# Patient Record
Sex: Female | Born: 1970 | Race: Black or African American | Hispanic: No | Marital: Single | Smoking: Current every day smoker
Health system: Southern US, Community
[De-identification: ages and names within clinical notes are randomized; demographics above are authoritative.]

## PROBLEM LIST (undated history)

## (undated) DIAGNOSIS — W3400XA Accidental discharge from unspecified firearms or gun, initial encounter: Secondary | ICD-10-CM

## (undated) DIAGNOSIS — K5909 Other constipation: Secondary | ICD-10-CM

## (undated) DIAGNOSIS — N390 Urinary tract infection, site not specified: Secondary | ICD-10-CM

## (undated) DIAGNOSIS — M542 Cervicalgia: Secondary | ICD-10-CM

## (undated) DIAGNOSIS — D649 Anemia, unspecified: Secondary | ICD-10-CM

## (undated) DIAGNOSIS — J40 Bronchitis, not specified as acute or chronic: Secondary | ICD-10-CM

## (undated) DIAGNOSIS — R569 Unspecified convulsions: Secondary | ICD-10-CM

## (undated) DIAGNOSIS — I10 Essential (primary) hypertension: Secondary | ICD-10-CM

## (undated) DIAGNOSIS — G8929 Other chronic pain: Secondary | ICD-10-CM

## (undated) DIAGNOSIS — R0602 Shortness of breath: Secondary | ICD-10-CM

## (undated) DIAGNOSIS — J45909 Unspecified asthma, uncomplicated: Secondary | ICD-10-CM

## (undated) HISTORY — PX: ABDOMINAL SURGERY: SHX537

---

## 1998-08-09 ENCOUNTER — Emergency Department (HOSPITAL_COMMUNITY): Admission: EM | Admit: 1998-08-09 | Discharge: 1998-08-09 | Payer: Self-pay | Admitting: Emergency Medicine

## 1998-10-21 ENCOUNTER — Other Ambulatory Visit: Admission: RE | Admit: 1998-10-21 | Discharge: 1998-10-21 | Payer: Self-pay | Admitting: Obstetrics

## 1999-10-20 ENCOUNTER — Inpatient Hospital Stay (HOSPITAL_COMMUNITY): Admission: AD | Admit: 1999-10-20 | Discharge: 1999-10-20 | Payer: Self-pay | Admitting: Obstetrics

## 2000-01-26 ENCOUNTER — Emergency Department (HOSPITAL_COMMUNITY): Admission: EM | Admit: 2000-01-26 | Discharge: 2000-01-26 | Payer: Self-pay | Admitting: Emergency Medicine

## 2000-02-02 ENCOUNTER — Emergency Department (HOSPITAL_COMMUNITY): Admission: EM | Admit: 2000-02-02 | Discharge: 2000-02-02 | Payer: Self-pay | Admitting: Emergency Medicine

## 2001-05-09 ENCOUNTER — Inpatient Hospital Stay (HOSPITAL_COMMUNITY): Admission: AD | Admit: 2001-05-09 | Discharge: 2001-05-09 | Payer: Self-pay | Admitting: Obstetrics

## 2001-06-30 ENCOUNTER — Other Ambulatory Visit: Admission: RE | Admit: 2001-06-30 | Discharge: 2001-06-30 | Payer: Self-pay | Admitting: Obstetrics and Gynecology

## 2001-11-25 ENCOUNTER — Encounter (INDEPENDENT_AMBULATORY_CARE_PROVIDER_SITE_OTHER): Payer: Self-pay

## 2001-11-25 ENCOUNTER — Ambulatory Visit (HOSPITAL_COMMUNITY): Admission: RE | Admit: 2001-11-25 | Discharge: 2001-11-25 | Payer: Self-pay | Admitting: Obstetrics and Gynecology

## 2002-02-24 ENCOUNTER — Emergency Department (HOSPITAL_COMMUNITY): Admission: EM | Admit: 2002-02-24 | Discharge: 2002-02-24 | Payer: Self-pay | Admitting: Emergency Medicine

## 2002-07-27 ENCOUNTER — Emergency Department (HOSPITAL_COMMUNITY): Admission: EM | Admit: 2002-07-27 | Discharge: 2002-07-27 | Payer: Self-pay | Admitting: Emergency Medicine

## 2002-10-25 ENCOUNTER — Emergency Department (HOSPITAL_COMMUNITY): Admission: EM | Admit: 2002-10-25 | Discharge: 2002-10-26 | Payer: Self-pay | Admitting: Emergency Medicine

## 2002-10-26 ENCOUNTER — Encounter: Payer: Self-pay | Admitting: Emergency Medicine

## 2002-11-07 ENCOUNTER — Emergency Department (HOSPITAL_COMMUNITY): Admission: EM | Admit: 2002-11-07 | Discharge: 2002-11-07 | Payer: Self-pay | Admitting: Emergency Medicine

## 2003-07-12 ENCOUNTER — Emergency Department (HOSPITAL_COMMUNITY): Admission: EM | Admit: 2003-07-12 | Discharge: 2003-07-12 | Payer: Self-pay

## 2003-08-29 ENCOUNTER — Emergency Department (HOSPITAL_COMMUNITY): Admission: EM | Admit: 2003-08-29 | Discharge: 2003-08-29 | Payer: Self-pay | Admitting: Emergency Medicine

## 2003-08-29 ENCOUNTER — Encounter: Payer: Self-pay | Admitting: Emergency Medicine

## 2004-10-10 ENCOUNTER — Emergency Department (HOSPITAL_COMMUNITY): Admission: EM | Admit: 2004-10-10 | Discharge: 2004-10-10 | Payer: Self-pay | Admitting: Emergency Medicine

## 2005-04-29 ENCOUNTER — Emergency Department (HOSPITAL_COMMUNITY): Admission: EM | Admit: 2005-04-29 | Discharge: 2005-04-29 | Payer: Self-pay | Admitting: Emergency Medicine

## 2005-09-16 ENCOUNTER — Emergency Department (HOSPITAL_COMMUNITY): Admission: EM | Admit: 2005-09-16 | Discharge: 2005-09-16 | Payer: Self-pay | Admitting: Family Medicine

## 2005-12-11 ENCOUNTER — Emergency Department (HOSPITAL_COMMUNITY): Admission: EM | Admit: 2005-12-11 | Discharge: 2005-12-11 | Payer: Self-pay | Admitting: Emergency Medicine

## 2007-07-30 ENCOUNTER — Emergency Department (HOSPITAL_COMMUNITY): Admission: EM | Admit: 2007-07-30 | Discharge: 2007-07-30 | Payer: Self-pay | Admitting: Emergency Medicine

## 2008-01-01 ENCOUNTER — Emergency Department (HOSPITAL_COMMUNITY): Admission: EM | Admit: 2008-01-01 | Discharge: 2008-01-01 | Payer: Self-pay | Admitting: Emergency Medicine

## 2008-01-04 ENCOUNTER — Emergency Department (HOSPITAL_COMMUNITY): Admission: EM | Admit: 2008-01-04 | Discharge: 2008-01-04 | Payer: Self-pay | Admitting: Emergency Medicine

## 2008-02-22 ENCOUNTER — Emergency Department (HOSPITAL_COMMUNITY): Admission: EM | Admit: 2008-02-22 | Discharge: 2008-02-22 | Payer: Self-pay | Admitting: Emergency Medicine

## 2008-07-27 ENCOUNTER — Emergency Department (HOSPITAL_COMMUNITY): Admission: EM | Admit: 2008-07-27 | Discharge: 2008-07-28 | Payer: Self-pay | Admitting: Emergency Medicine

## 2008-09-09 ENCOUNTER — Emergency Department (HOSPITAL_COMMUNITY): Admission: EM | Admit: 2008-09-09 | Discharge: 2008-09-10 | Payer: Self-pay | Admitting: Family Medicine

## 2010-02-08 ENCOUNTER — Inpatient Hospital Stay (HOSPITAL_COMMUNITY): Admission: AD | Admit: 2010-02-08 | Discharge: 2010-02-08 | Payer: Self-pay | Admitting: Obstetrics and Gynecology

## 2010-02-08 ENCOUNTER — Ambulatory Visit: Payer: Self-pay | Admitting: Obstetrics and Gynecology

## 2011-03-04 LAB — WET PREP, GENITAL: Trich, Wet Prep: NONE SEEN

## 2011-03-04 LAB — HERPES SIMPLEX VIRUS CULTURE

## 2011-04-04 ENCOUNTER — Inpatient Hospital Stay (HOSPITAL_COMMUNITY)
Admission: EM | Admit: 2011-04-04 | Discharge: 2011-04-08 | DRG: 313 | Disposition: A | Discharge status: Other Acute Inpatient Hospital | Payer: 59 | Attending: Internal Medicine | Admitting: Internal Medicine

## 2011-04-04 ENCOUNTER — Emergency Department (HOSPITAL_COMMUNITY): Payer: Self-pay

## 2011-04-04 DIAGNOSIS — R0789 Other chest pain: Principal | ICD-10-CM | POA: Diagnosis present

## 2011-04-04 DIAGNOSIS — J45909 Unspecified asthma, uncomplicated: Secondary | ICD-10-CM | POA: Diagnosis present

## 2011-04-04 DIAGNOSIS — D509 Iron deficiency anemia, unspecified: Secondary | ICD-10-CM | POA: Diagnosis present

## 2011-04-04 DIAGNOSIS — F341 Dysthymic disorder: Secondary | ICD-10-CM | POA: Diagnosis present

## 2011-04-04 LAB — BASIC METABOLIC PANEL
CO2: 25 mEq/L (ref 19–32)
Calcium: 9.7 mg/dL (ref 8.4–10.5)
Creatinine, Ser: 0.71 mg/dL (ref 0.4–1.2)
GFR calc Af Amer: >60 mL/min (ref >60)
GFR calc non Af Amer: >60 mL/min (ref >60)
Glucose, Bld: 84 mg/dL (ref 70–99)

## 2011-04-04 LAB — POCT CARDIAC MARKERS
CKMB, poc: <1 ng/mL — ABNORMAL LOW (ref 1.0–8.0)
Myoglobin, poc: 45.6 ng/mL (ref 12–200)
Myoglobin, poc: 43.4 ng/mL (ref 12–200)

## 2011-04-04 LAB — CBC
HCT: 35.6 % — ABNORMAL LOW (ref 36.0–46.0)
Hemoglobin: 11.9 g/dL — ABNORMAL LOW (ref 12.0–15.0)
MCHC: 33.4 g/dL (ref 30.0–36.0)
RBC: 4.04 MIL/uL (ref 3.87–5.11)
WBC: 7.1 10*3/uL (ref 4.0–10.5)

## 2011-04-04 LAB — DIFFERENTIAL
Basophils Absolute: 0 10*3/uL (ref 0.0–0.1)
Basophils Relative: 0 % (ref 0–1)
Lymphocytes Relative: 48 % — ABNORMAL HIGH (ref 12–46)
Monocytes Absolute: 0.5 10*3/uL (ref 0.1–1.0)
Neutro Abs: 3.1 10*3/uL (ref 1.7–7.7)
Neutrophils Relative %: 43 % (ref 43–77)

## 2011-04-05 ENCOUNTER — Emergency Department (HOSPITAL_COMMUNITY): Payer: Self-pay

## 2011-04-05 DIAGNOSIS — R072 Precordial pain: Secondary | ICD-10-CM

## 2011-04-05 LAB — CARDIAC PANEL(CRET KIN+CKTOT+MB+TROPI)
CK, MB: 0.6 ng/mL (ref 0.3–4.0)
Relative Index: 0.6 (ref 0.0–2.5)
Total CK: 104 U/L (ref 7–177)
Troponin I: <0.01 ng/mL (ref 0.00–0.06)

## 2011-04-05 LAB — LIPID PANEL
Cholesterol: 104 mg/dL (ref 0–200)
HDL: 44 mg/dL (ref >39)
Triglycerides: 84 mg/dL (ref <150)

## 2011-04-05 LAB — RAPID URINE DRUG SCREEN, HOSP PERFORMED
Amphetamines: NOT DETECTED
Barbiturates: NOT DETECTED
Benzodiazepines: NOT DETECTED
Opiates: POSITIVE — AB
Tetrahydrocannabinol: POSITIVE — AB

## 2011-04-05 LAB — PHOSPHORUS: Phosphorus: 4.1 mg/dL (ref 2.3–4.6)

## 2011-04-05 LAB — FOLATE: Folate: 9.6 ng/mL

## 2011-04-05 LAB — IRON AND TIBC: Iron: 65 ug/dL (ref 42–135)

## 2011-04-05 MED ORDER — IOHEXOL 300 MG/ML  SOLN
100.0000 mL | Freq: Once | INTRAMUSCULAR | Status: AC | PRN
  Administered 2011-04-05: 100 mL via INTRAVENOUS

## 2011-04-06 DIAGNOSIS — F331 Major depressive disorder, recurrent, moderate: Secondary | ICD-10-CM

## 2011-04-06 LAB — BASIC METABOLIC PANEL
CO2: 26 mEq/L (ref 19–32)
GFR calc non Af Amer: >60 mL/min (ref >60)
Glucose, Bld: 91 mg/dL (ref 70–99)
Potassium: 4 mEq/L (ref 3.5–5.1)
Sodium: 138 mEq/L (ref 135–145)

## 2011-04-06 LAB — CBC
HCT: 29 % — ABNORMAL LOW (ref 36.0–46.0)
Hemoglobin: 9.3 g/dL — ABNORMAL LOW (ref 12.0–15.0)
RBC: 3.23 MIL/uL — ABNORMAL LOW (ref 3.87–5.11)
RDW: 14.3 % (ref 11.5–15.5)
WBC: 5.6 10*3/uL (ref 4.0–10.5)

## 2011-04-08 ENCOUNTER — Inpatient Hospital Stay (HOSPITAL_COMMUNITY)
Admission: AD | Admit: 2011-04-08 | Discharge: 2011-04-09 | DRG: 885 | Disposition: A | Discharge status: Home / Self Care | Payer: PRIVATE HEALTH INSURANCE | Source: Ambulatory Visit | Attending: Psychiatry | Admitting: Psychiatry

## 2011-04-08 DIAGNOSIS — F1210 Cannabis abuse, uncomplicated: Secondary | ICD-10-CM

## 2011-04-08 DIAGNOSIS — IMO0002 Reserved for concepts with insufficient information to code with codable children: Secondary | ICD-10-CM

## 2011-04-08 DIAGNOSIS — J45909 Unspecified asthma, uncomplicated: Secondary | ICD-10-CM

## 2011-04-08 DIAGNOSIS — Z6379 Other stressful life events affecting family and household: Secondary | ICD-10-CM

## 2011-04-08 DIAGNOSIS — F332 Major depressive disorder, recurrent severe without psychotic features: Principal | ICD-10-CM

## 2011-04-08 DIAGNOSIS — F4310 Post-traumatic stress disorder, unspecified: Secondary | ICD-10-CM

## 2011-04-08 DIAGNOSIS — Z818 Family history of other mental and behavioral disorders: Secondary | ICD-10-CM
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
## 2011-04-10 NOTE — Discharge Summary (Addendum)
  [name], POLLIO               ACCOUNT NO.:  [id number]  MEDICAL RECORD NO.:  [id number]  LOCATION:                                 FACILITY:  PHYSICIAN:  Franchot Gallo, MD     DATE OF BIRTH:  10/05/1971  DATE OF ADMISSION:  04/07/2011 DATE OF DISCHARGE:                              DISCHARGE SUMMARY   REASON FOR ADMISSION:  Patient was a transfer from the medical floor after she was assessed for chest pain, admitted for grief issues regarding her sister's death.  FINAL IMPRESSION:  Bereavement. AXIS II:  Deferred. AXIS III:  Atypical chest pain. AXIS IV:  Psychosocial stressors. AXIS V:  55.  SIGNIFICANT LABS:  CBC had a hemoglobin of 11.9.  Basic metabolic panel within normal limits.  Cardiac markers were negative.  TSH was within normal limits.  Anemia panel showed a ferritin of 10.  SIGNIFICANT FINDINGS:  Patient was admitted to the adult milieu with continuing her discharge medications.  We changed her Prozac to Zoloft and continued her iron tablets and her Protonix.  She was attending groups.  We contacted her friend, Vernona Rieger, to gather collateral information, to provide information on suicide, and to address any safety concerns and her friend felt that her belief for patient's admission was that it was related to the grief over patient's sister and the fact that the recent trial about her sister's death by murder. Friend also stated that patient will stay with her for approximately a week so the patient could be in a calmer, less stressful situation.  On day of discharge, patient's depression was mild to moderate.  Her sleep was decreased.  Her appetite was fair but she denied any suicidal or homicidal thoughts or hallucinations or delusional thinking.  No manic or hypomanic symptoms.  She was requesting discharge today, that she must take care of her family members, grandfather and mother.  She was willing to continue her medications on an outpatient  basis.  DISCHARGE MEDICATIONS:  Included: 1. Zoloft 50 mg one daily. 2. Colace 1 pill twice a day as needed for constipation. 3. Ferrous sulfate 1 tablet t.i.d. 4. Protonix 40 mg daily. 5. Patient was to stop taking her Prozac.  FOLLOWUP APPOINTMENT:  With [facility] on Wednesday, Apr 15, 2011, at 9 a.m. at [phone].     Landry Corporal, N.P.   ______________________________ Franchot Gallo, MD    JO/MEDQ  D:  04/09/2011  T:  04/09/2011  Job:  [id number]  Electronically Signed by Franchot Gallo MD on 05/22/2011 06:11:59 PM

## 2011-04-11 NOTE — Discharge Summary (Signed)
NAMEADALIN, [name]               ACCOUNT NO.:  [id number]  MEDICAL RECORD NO.:  [id number]           PATIENT TYPE:  I  LOCATION:  1403                         FACILITY:  [facility]  PHYSICIAN:  Kathlen Mody, MD       DATE OF BIRTH:  Jan 23, 1971  DATE OF ADMISSION:  04/04/2011 DATE OF DISCHARGE:  04/07/2011                              DISCHARGE SUMMARY   DISCHARGE DIAGNOSES: 1. Atypical chest pain most likely secondary to anxiety versus     costochondritis. 2. Depression. 3. Iron-deficiency anemia.  DISCHARGE MEDICATIONS: 1. Protonix 40 mg daily. 2. Tylenol 325 mg p.o. 2 tablets q.4 h. p.r.n. 3. Prozac 20 mg 1 tablet daily. 4. Alprazolam 0.25 mg 1 tablet daily p.r.n. 5. Colace 100 mg b.i.d. p.r.n. 6. Ferrous sulfate 325 mg 1 tablet three times a day. 7. Tramadol 50 mg 1 tablet q.8 hours p.r.n.  PERTINENT LABS:  On admission, the patient had a CBC done which showed a hemoglobin of 11.9 and hematocrit of 35.6.  Point-of-care cardiac markers negative.  Basic metabolic panel within normal limits.  The next three sets of cardiac markers were negative.  TSH was within normal limits.  Anemia panel showed ferritin of 10.  Next CBC showed a hemoglobin of 9.3 with hematocrit of 29.  Basic metabolic panel was within normal limits.  RADIOLOGY:  The patient had a CT angiogram, which was negative for pulmonary embolus.  No pneumonia.  Chest x-ray did not show any acute findings.  CONSULTS CALLED:  Psychiatric consult.  BRIEF HOSPITAL COURSE:  40 year old lady with history of asthma was admitted for chest pain.  She was admitted to rule out acute coronary syndrome.  Her enzymes have been negative.  2D echocardiogram was within normal limits.  Her chest pain is atypical most likely secondary to anxiety/depression.  She was started on Xanax 0.25 mg on daily p.r.n. as needed which seem to have helped with the chest pain.  She was also started on Protonix for possible  heartburn.  Iron-deficiency anemia:  Her ferritin was low.  She was started on iron supplements three times a day.  Depression/anxiety.  The patient has depression with depressed mood, which was related to the death of her sister who died last year.  At this point, psychiatric consult was called, who recommended home Prozac 20 mg and also recommended with group therapy with medications stabilization.  Recommended transferring to [facility], once the patient is medically clear.  PHYSICAL EXAMINATION:  Vital Signs:  The patient's vitals today, temperature of 98.5, pulse of 55, respirations 16, blood pressure 109/72, saturating 100% on room air. General:  She is alert, afebrile, oriented x3 in no flat mood. Cardiovascular:  S1, S2 heard. Respiratory:  Good air entry bilaterally. Abdomen:  Soft, bowel sounds are heard. Extremities: No pedal edema.  DISPOSITION:  The patient is hemodynamically stable to be discharged to [facility].  She was recommended to follow up with PCP after being discharged from the [facility] in about 1 to 2 weeks.          ______________________________ Kathlen Mody, MD  VA/MEDQ  D:  04/07/2011  T:  04/07/2011  Job:  [id number]  Electronically Signed by Kathlen Mody MD on 04/11/2011 01:34:43 PM
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
## 2011-08-22 ENCOUNTER — Emergency Department (HOSPITAL_COMMUNITY): Payer: PRIVATE HEALTH INSURANCE

## 2011-08-22 ENCOUNTER — Emergency Department (HOSPITAL_COMMUNITY)
Admission: EM | Admit: 2011-08-22 | Discharge: 2011-08-23 | Disposition: A | Discharge status: Home / Self Care | Payer: PRIVATE HEALTH INSURANCE | Attending: Emergency Medicine | Admitting: Emergency Medicine

## 2011-08-22 DIAGNOSIS — W010XXA Fall on same level from slipping, tripping and stumbling without subsequent striking against object, initial encounter: Secondary | ICD-10-CM | POA: Insufficient documentation

## 2011-08-22 DIAGNOSIS — Y9229 Other specified public building as the place of occurrence of the external cause: Secondary | ICD-10-CM | POA: Insufficient documentation

## 2011-08-22 DIAGNOSIS — M542 Cervicalgia: Secondary | ICD-10-CM | POA: Insufficient documentation

## 2011-08-22 DIAGNOSIS — R209 Unspecified disturbances of skin sensation: Secondary | ICD-10-CM | POA: Insufficient documentation

## 2011-08-22 DIAGNOSIS — J45909 Unspecified asthma, uncomplicated: Secondary | ICD-10-CM | POA: Insufficient documentation

## 2011-08-22 DIAGNOSIS — S40019A Contusion of unspecified shoulder, initial encounter: Secondary | ICD-10-CM | POA: Insufficient documentation

## 2011-08-22 DIAGNOSIS — M25519 Pain in unspecified shoulder: Secondary | ICD-10-CM | POA: Insufficient documentation

## 2011-08-25 ENCOUNTER — Emergency Department (HOSPITAL_COMMUNITY)
Admission: EM | Admit: 2011-08-25 | Discharge: 2011-08-25 | Disposition: A | Discharge status: Home / Self Care | Payer: PRIVATE HEALTH INSURANCE | Attending: Emergency Medicine | Admitting: Emergency Medicine

## 2011-08-25 ENCOUNTER — Emergency Department (HOSPITAL_COMMUNITY): Payer: PRIVATE HEALTH INSURANCE

## 2011-08-25 DIAGNOSIS — S2239XA Fracture of one rib, unspecified side, initial encounter for closed fracture: Secondary | ICD-10-CM | POA: Insufficient documentation

## 2011-08-25 DIAGNOSIS — Y92009 Unspecified place in unspecified non-institutional (private) residence as the place of occurrence of the external cause: Secondary | ICD-10-CM | POA: Insufficient documentation

## 2011-08-25 DIAGNOSIS — R079 Chest pain, unspecified: Secondary | ICD-10-CM | POA: Insufficient documentation

## 2011-08-25 DIAGNOSIS — W19XXXA Unspecified fall, initial encounter: Secondary | ICD-10-CM | POA: Insufficient documentation

## 2011-08-25 DIAGNOSIS — J45909 Unspecified asthma, uncomplicated: Secondary | ICD-10-CM | POA: Insufficient documentation

## 2011-09-07 LAB — POCT PREGNANCY, URINE: Operator id: 285841

## 2011-09-11 LAB — URINALYSIS, ROUTINE W REFLEX MICROSCOPIC
Bilirubin Urine: NEGATIVE
Nitrite: NEGATIVE
Specific Gravity, Urine: 1.005
pH: 8

## 2011-09-11 LAB — URINE MICROSCOPIC-ADD ON

## 2011-09-11 LAB — POCT PREGNANCY, URINE: Preg Test, Ur: NEGATIVE

## 2011-09-14 LAB — DIFFERENTIAL
Basophils Relative: 0
Eosinophils Absolute: 0
Lymphs Abs: 0.7
Monocytes Absolute: 0.2
Monocytes Relative: 3

## 2011-09-14 LAB — URINALYSIS, ROUTINE W REFLEX MICROSCOPIC
Glucose, UA: NEGATIVE
Hgb urine dipstick: NEGATIVE
Protein, ur: 30 — AB
pH: 6.5

## 2011-09-14 LAB — COMPREHENSIVE METABOLIC PANEL
ALT: 18
Albumin: 3.9
Alkaline Phosphatase: 52
GFR calc Af Amer: >60
Potassium: 3.8
Sodium: 142
Total Protein: 7.5

## 2011-09-14 LAB — URINE MICROSCOPIC-ADD ON

## 2011-09-14 LAB — CBC
Platelets: 288
RDW: 14.9

## 2011-09-14 LAB — POCT PREGNANCY, URINE: Preg Test, Ur: NEGATIVE

## 2011-10-13 ENCOUNTER — Ambulatory Visit
Payer: PRIVATE HEALTH INSURANCE | Attending: Orthopedic Surgery | Admitting: Rehabilitative and Restorative Service Providers"

## 2011-10-13 DIAGNOSIS — M25519 Pain in unspecified shoulder: Secondary | ICD-10-CM | POA: Insufficient documentation

## 2011-10-13 DIAGNOSIS — IMO0001 Reserved for inherently not codable concepts without codable children: Secondary | ICD-10-CM | POA: Insufficient documentation

## 2011-10-13 DIAGNOSIS — M542 Cervicalgia: Secondary | ICD-10-CM | POA: Insufficient documentation

## 2011-10-19 ENCOUNTER — Ambulatory Visit: Payer: PRIVATE HEALTH INSURANCE | Attending: Orthopedic Surgery | Admitting: Physical Therapy

## 2011-10-19 DIAGNOSIS — M542 Cervicalgia: Secondary | ICD-10-CM | POA: Insufficient documentation

## 2011-10-19 DIAGNOSIS — IMO0001 Reserved for inherently not codable concepts without codable children: Secondary | ICD-10-CM | POA: Insufficient documentation

## 2011-10-19 DIAGNOSIS — M25519 Pain in unspecified shoulder: Secondary | ICD-10-CM | POA: Insufficient documentation

## 2011-10-21 ENCOUNTER — Ambulatory Visit: Payer: PRIVATE HEALTH INSURANCE | Admitting: Physical Therapy

## 2011-10-26 ENCOUNTER — Ambulatory Visit: Payer: PRIVATE HEALTH INSURANCE | Admitting: Rehabilitative and Restorative Service Providers"

## 2011-10-28 ENCOUNTER — Ambulatory Visit: Payer: PRIVATE HEALTH INSURANCE | Admitting: Rehabilitative and Restorative Service Providers"

## 2011-11-11 ENCOUNTER — Ambulatory Visit: Payer: PRIVATE HEALTH INSURANCE | Admitting: Rehabilitation

## 2011-11-13 ENCOUNTER — Ambulatory Visit: Payer: PRIVATE HEALTH INSURANCE | Admitting: Rehabilitative and Restorative Service Providers"

## 2011-11-16 ENCOUNTER — Encounter: Payer: PRIVATE HEALTH INSURANCE | Admitting: Rehabilitative and Restorative Service Providers"

## 2011-11-17 ENCOUNTER — Ambulatory Visit
Payer: PRIVATE HEALTH INSURANCE | Attending: Orthopedic Surgery | Admitting: Rehabilitative and Restorative Service Providers"

## 2011-11-17 DIAGNOSIS — M25519 Pain in unspecified shoulder: Secondary | ICD-10-CM | POA: Insufficient documentation

## 2011-11-17 DIAGNOSIS — IMO0001 Reserved for inherently not codable concepts without codable children: Secondary | ICD-10-CM | POA: Insufficient documentation

## 2011-11-17 DIAGNOSIS — M542 Cervicalgia: Secondary | ICD-10-CM | POA: Insufficient documentation

## 2011-11-18 ENCOUNTER — Ambulatory Visit: Payer: PRIVATE HEALTH INSURANCE | Admitting: Rehabilitative and Restorative Service Providers"

## 2011-11-24 ENCOUNTER — Ambulatory Visit: Payer: PRIVATE HEALTH INSURANCE | Admitting: Rehabilitation

## 2011-11-27 ENCOUNTER — Ambulatory Visit: Payer: PRIVATE HEALTH INSURANCE | Admitting: Rehabilitation

## 2011-12-01 ENCOUNTER — Ambulatory Visit: Payer: PRIVATE HEALTH INSURANCE | Admitting: Rehabilitative and Restorative Service Providers"

## 2011-12-04 ENCOUNTER — Ambulatory Visit: Payer: PRIVATE HEALTH INSURANCE | Admitting: Rehabilitation

## 2012-01-01 ENCOUNTER — Other Ambulatory Visit (HOSPITAL_COMMUNITY): Payer: Self-pay | Admitting: Orthopedic Surgery

## 2012-01-01 DIAGNOSIS — IMO0002 Reserved for concepts with insufficient information to code with codable children: Secondary | ICD-10-CM

## 2012-01-08 ENCOUNTER — Ambulatory Visit (HOSPITAL_COMMUNITY)
Admission: RE | Admit: 2012-01-08 | Discharge: 2012-01-08 | Disposition: A | Discharge status: Home / Self Care | Payer: PRIVATE HEALTH INSURANCE | Source: Ambulatory Visit | Attending: Orthopedic Surgery | Admitting: Orthopedic Surgery

## 2012-01-08 DIAGNOSIS — M79609 Pain in unspecified limb: Secondary | ICD-10-CM | POA: Insufficient documentation

## 2012-01-08 DIAGNOSIS — IMO0002 Reserved for concepts with insufficient information to code with codable children: Secondary | ICD-10-CM

## 2012-01-08 DIAGNOSIS — M542 Cervicalgia: Secondary | ICD-10-CM | POA: Insufficient documentation

## 2012-01-08 DIAGNOSIS — M5020 Other cervical disc displacement, unspecified cervical region: Secondary | ICD-10-CM | POA: Insufficient documentation

## 2012-10-13 ENCOUNTER — Encounter (HOSPITAL_COMMUNITY): Payer: Self-pay

## 2012-10-13 ENCOUNTER — Emergency Department (HOSPITAL_COMMUNITY): Payer: Self-pay

## 2012-10-13 ENCOUNTER — Emergency Department (HOSPITAL_COMMUNITY)
Admission: EM | Admit: 2012-10-13 | Discharge: 2012-10-14 | Disposition: A | Discharge status: Home / Self Care | Payer: Self-pay | Attending: Emergency Medicine | Admitting: Emergency Medicine

## 2012-10-13 DIAGNOSIS — M5412 Radiculopathy, cervical region: Secondary | ICD-10-CM | POA: Insufficient documentation

## 2012-10-13 DIAGNOSIS — F17200 Nicotine dependence, unspecified, uncomplicated: Secondary | ICD-10-CM | POA: Insufficient documentation

## 2012-10-13 DIAGNOSIS — J45909 Unspecified asthma, uncomplicated: Secondary | ICD-10-CM | POA: Insufficient documentation

## 2012-10-13 DIAGNOSIS — N390 Urinary tract infection, site not specified: Secondary | ICD-10-CM | POA: Insufficient documentation

## 2012-10-13 HISTORY — DX: Unspecified asthma, uncomplicated: J45.909

## 2012-10-13 HISTORY — DX: Bronchitis, not specified as acute or chronic: J40

## 2012-10-13 MED ORDER — HYDROMORPHONE HCL PF 1 MG/ML IJ SOLN
0.5000 mg | Freq: Once | INTRAMUSCULAR | Status: AC
  Administered 2012-10-13: 0.5 mg via INTRAVENOUS
  Filled 2012-10-13: qty 1

## 2012-10-13 MED ORDER — MORPHINE SULFATE 4 MG/ML IJ SOLN
4.0000 mg | Freq: Once | INTRAMUSCULAR | Status: AC
  Administered 2012-10-13: 4 mg via INTRAVENOUS
  Filled 2012-10-13: qty 1

## 2012-10-13 MED ORDER — PREDNISONE (PAK) 10 MG PO TABS: ORAL_TABLET | ORAL | Status: DC

## 2012-10-13 MED ORDER — DIPHENHYDRAMINE HCL 50 MG/ML IJ SOLN
25.0000 mg | Freq: Four times a day (QID) | INTRAMUSCULAR | Status: DC | PRN
  Administered 2012-10-13: 25 mg via INTRAVENOUS
  Filled 2012-10-13: qty 1

## 2012-10-13 MED ORDER — ONDANSETRON HCL 4 MG/2ML IJ SOLN
4.0000 mg | Freq: Once | INTRAMUSCULAR | Status: AC
  Administered 2012-10-13: 4 mg via INTRAVENOUS
  Filled 2012-10-13: qty 2

## 2012-10-13 NOTE — ED Notes (Signed)
Report given to Dee, RN 

## 2012-10-13 NOTE — ED Notes (Signed)
Asked pt about pain; pt stated it is still a 7/10. Asked pt if she would like anything for pain of to ask doctor about pain medication for her. Pt states that she could wait a few minutes since they gave her something before she came to CDU, but said that doctor could be asked about pain medication in a few minutes.

## 2012-10-13 NOTE — ED Notes (Signed)
Patient injured her right shoulder and arm from a fall in September

## 2012-10-13 NOTE — ED Provider Notes (Signed)
History     CSN: [id number]  Arrival date & time 10/13/12  1510   First MD Initiated Contact with Patient 10/13/12 1548      Chief Complaint  Patient presents with  . Shoulder Pain    (Consider location/radiation/quality/duration/timing/severity/associated sxs/prior treatment) HPI Comments: This is a 41 year old female, who presents to the emergency department with chief complaint of right shoulder pain times one week. Patient states that the pain has been worsening. She has been seeing a chiropractor, but has lost followup. Today she says that she feels increased pain in her right arm. She tells me she has taken a muscle relaxant and Tylenol for pain, was literally. She denies any focal neural deficits. She states that she has not had any bowel incontinence, but has been incontinent for urine 2 in the past week. She states that she can feel her bladder filling, and has good sensation desire to urinate, but sometimes she is unable to make it to the bathroom before she has already urinated.  She has not had prior back surgery.  She denies headache, chest pain, and SOB.  The history is provided by the patient. No language interpreter was used.    Past Medical History  Diagnosis Date  . Asthma   . Bronchitis     Past Surgical History  Procedure Date  . Abdominal surgery     No family history on file.  History  Substance Use Topics  . Smoking status: Current Every Day Smoker  . Smokeless tobacco: Not on file  . Alcohol Use: No    OB History    Grav Para Term Preterm Abortions TAB SAB Ect Mult Living                  Review of Systems  Musculoskeletal:       Right arm pain  All other systems reviewed and are negative.    Allergies  Review of patient's allergies indicates no known allergies.  Home Medications  No current outpatient prescriptions on file.  BP 120/93  Pulse 71  Temp 97.9 F (36.6 C) (Oral)  Resp 14  SpO2 100%  LMP 10/12/2012  Physical Exam    Nursing note and vitals reviewed. Constitutional: She is oriented to person, place, and time. She appears well-developed and well-nourished.  HENT:  Head: Normocephalic and atraumatic.  Eyes: Conjunctivae normal and EOM are normal. Pupils are equal, round, and reactive to light.  Neck: Normal range of motion. Neck supple.  Cardiovascular: Normal rate, regular rhythm and normal heart sounds.   Abdominal: Soft. She exhibits no distension and no mass. There is no tenderness. There is no rebound and no guarding.  Genitourinary:       Good rectal tone  Musculoskeletal:       Sensation intact bilaterally, strength is 4/5 in the right arm. Distal pulses are equal. Capillary refill is brisk.  Neurological: She is alert and oriented to person, place, and time.       Reflexes are equal bilaterally  Skin: Skin is warm and dry.  Psychiatric: She has a normal mood and affect. Her behavior is normal. Judgment and thought content normal.    ED Course  Procedures (including critical care time)  Labs Reviewed - No data to display No results found.   No diagnosis found.    MDM   41 year old female with right arm pain. This patient has been seen by and discussed with Dr. Bebe Shaggy.   5:31 PM We are going to  postvoid residual study to evaluate for overflow incontinence.  6:09 PM Bedside ultrasound performed by Dr. Bebe Shaggy.  Patient still had quite a bit of urine retained in bladder.  Rectal tone is good.  We are going to send the patient to the CDU, and order MRI of L-spine and T-spine.  Patient is to be discharged home with surgical follow-up if MRI results do not suggest emergent surgery would be necessary.  Patient has been discussed with Adaline Sill, NP, who will resume care at this time.         Roxy Horseman, PA-C 10/13/12 2006

## 2012-10-13 NOTE — ED Notes (Signed)
Back in to reassess patient, pt left antecubital red around visible vein. No redness or swelling around forearm IV site. When giving meds, no problem occurred, no pain or redness. IV site removed, PA made aware. Pt given heat pack to antecubital.

## 2012-10-13 NOTE — ED Notes (Addendum)
Pt states she fell a year ago onto her right shoulder, but this week her shoulder has been giving her 10/10 achy pain. She states "I feel like my whole arm is trying to come off, thumb(of right hand) is numb and cold". Pt A&Ox4, ambulatory. Pt was supposed to get neck surgery, she is searching for a laser surgery option.

## 2012-10-13 NOTE — ED Notes (Signed)
Pt is back in room from MRI.

## 2012-10-13 NOTE — ED Provider Notes (Signed)
MRI results reviewed and discussed with Dr. Bebe Shaggy, shared with patient.  No significant findings in thoracic or lumbar spine.  Patient continues to report pain in right shoulder and paresthesia to right hand, likely as result of her cervical spine findings at C5-6 (from 01/08/12):  Congenital stenosis. Central disc extrusion with significant mass effect on the cord. Canal diameter 5 mm. Central cord hyperintensity. Bilateral C6 nerve root encroachment. Patient has been evaluated previously by Dr. Yevette Edwards for same, with recommendation for surgical intervention.  Patient has been reluctant to undergo surgery.  Will start on a short course of steroids and provide prescription for norco.  Patient encouraged to continue follow-up with Dr. Yevette Edwards.  Will also check urine to r/o infection as cause of urinary frequency/retention.  Kristen Norman, NP 10/14/12 0020

## 2012-10-13 NOTE — ED Notes (Signed)
Patient presents with right shoulder and arm pain x 1 week with worsening pain today.  Patient denies chest pain and SOB. Patient tearful upon triage and states "my hands feel icy".

## 2012-10-13 NOTE — ED Notes (Signed)
Patient transported to MRI 

## 2012-10-13 NOTE — ED Provider Notes (Signed)
Pt with h/o cervical spine disease here with worsened pain.  No focal arm weakness noted but reports thoracic spinal tenderness.  She also reports urinary incontinence - post void residual does indicate increased bladder volume.  Given she has focal spinal tenderness will obtain MR thoracic/lumbar spine.   Joya Gaskins, MD 10/13/12 [phone]

## 2012-10-13 NOTE — ED Notes (Signed)
MD at bedside. 

## 2012-10-14 LAB — URINALYSIS, ROUTINE W REFLEX MICROSCOPIC
Bilirubin Urine: NEGATIVE
Ketones, ur: NEGATIVE mg/dL
Nitrite: NEGATIVE
Protein, ur: NEGATIVE mg/dL
Urobilinogen, UA: 1 mg/dL (ref 0.0–1.0)
pH: 7 (ref 5.0–8.0)

## 2012-10-14 LAB — URINE MICROSCOPIC-ADD ON

## 2012-10-14 MED ORDER — SULFAMETHOXAZOLE-TRIMETHOPRIM 800-160 MG PO TABS: 1.0000 | ORAL_TABLET | Freq: Two times a day (BID) | ORAL | Status: DC

## 2012-10-14 MED ORDER — HYDROCODONE-ACETAMINOPHEN 5-325 MG PO TABS: 1.0000 | ORAL_TABLET | Freq: Four times a day (QID) | ORAL | Status: DC | PRN

## 2012-10-14 NOTE — ED Provider Notes (Signed)
Medical screening examination/treatment/procedure(s) were conducted as a shared visit with non-physician practitioner(s) and myself.  I personally evaluated the patient during the encounter   Joya Gaskins, MD 10/14/12 1534

## 2012-10-14 NOTE — ED Provider Notes (Signed)
Medical screening examination/treatment/procedure(s) were conducted as a shared visit with non-physician practitioner(s) and myself.  I personally evaluated the patient during the encounter   Joya Gaskins, MD 10/14/12 1531

## 2012-11-01 ENCOUNTER — Other Ambulatory Visit (HOSPITAL_COMMUNITY): Payer: Self-pay | Admitting: Neurosurgery

## 2012-11-01 DIAGNOSIS — M5020 Other cervical disc displacement, unspecified cervical region: Secondary | ICD-10-CM

## 2012-11-02 ENCOUNTER — Emergency Department (HOSPITAL_COMMUNITY)
Admission: EM | Admit: 2012-11-02 | Discharge: 2012-11-02 | Disposition: A | Discharge status: Home / Self Care | Payer: Self-pay | Attending: Emergency Medicine | Admitting: Emergency Medicine

## 2012-11-02 ENCOUNTER — Encounter (HOSPITAL_COMMUNITY): Payer: Self-pay | Admitting: Physical Medicine and Rehabilitation

## 2012-11-02 DIAGNOSIS — M79609 Pain in unspecified limb: Secondary | ICD-10-CM | POA: Insufficient documentation

## 2012-11-02 DIAGNOSIS — F17200 Nicotine dependence, unspecified, uncomplicated: Secondary | ICD-10-CM | POA: Insufficient documentation

## 2012-11-02 DIAGNOSIS — M79603 Pain in arm, unspecified: Secondary | ICD-10-CM

## 2012-11-02 DIAGNOSIS — J45909 Unspecified asthma, uncomplicated: Secondary | ICD-10-CM | POA: Insufficient documentation

## 2012-11-02 MED ORDER — OXYCODONE-ACETAMINOPHEN 5-325 MG PO TABS: 1.0000 | ORAL_TABLET | ORAL | Status: DC | PRN

## 2012-11-02 MED ORDER — MORPHINE SULFATE 4 MG/ML IJ SOLN
4.0000 mg | Freq: Once | INTRAMUSCULAR | Status: AC
  Administered 2012-11-02: 4 mg via INTRAMUSCULAR
  Filled 2012-11-02: qty 1

## 2012-11-02 NOTE — ED Notes (Signed)
Patient is alert and orientedx4.  Patient was explained discharge instructions and she had no questions.  We did provide the patient with a resource guide so that she has options if she needs further help.

## 2012-11-02 NOTE — ED Notes (Signed)
C/o right arm pain since before weekend, states right thumb numb due to disc problems, was seen for this late October, was given MRI and antibiotics. Mop deformity or swelling noted to arm, pt crying, also c/o constipation

## 2012-11-02 NOTE — Progress Notes (Signed)
Orthopedic Tech Progress Note Patient Details:  [name] 14-Apr-1971 [id number]  Ortho Devices Type of Ortho Device: Arm foam sling Ortho Device/Splint Location: RIGHT ARM SLING Ortho Device/Splint Interventions: Application   Cammer, Mickie Bail 11/02/2012, 3:41 PM

## 2012-11-02 NOTE — ED Provider Notes (Signed)
History    This chart was scribed for Kristen Guppy, MD, MD by Smitty Pluck, ED Scribe. The patient was seen in room TR05C and the patient's care was started at 2:33PM.   CSN: [id number]  Arrival date & time 11/02/12  1342   None     Chief Complaint  Patient presents with  . Arm Pain    (Consider location/radiation/quality/duration/timing/severity/associated sxs/prior treatment) Patient is a 41 y.o. female presenting with arm pain. The history is provided by the patient. No language interpreter was used.  Arm Pain Pertinent negatives include no shortness of breath.   [name] is a 41 y.o. female who presents to the Emergency Department complaining of constant, severe right arm pain onset 20 days ago with symptoms worsening 4 days ago. Pt reports that he fell and injured her arm in 08-2011. Pt reports pain is rated at 1010. She reports she feels a small mass that travels throughout her arm. Denies any other pain.    Past Medical History  Diagnosis Date  . Asthma   . Bronchitis     Past Surgical History  Procedure Date  . Abdominal surgery     No family history on file.  History  Substance Use Topics  . Smoking status: Current Every Day Smoker    Types: Cigarettes  . Smokeless tobacco: Not on file  . Alcohol Use: No    OB History    Grav Para Term Preterm Abortions TAB SAB Ect Mult Living                  Review of Systems  Constitutional: Negative for fever and chills.  Respiratory: Negative for shortness of breath.   Gastrointestinal: Negative for nausea and vomiting.  Neurological: Negative for weakness.  All other systems reviewed and are negative.    Allergies  Review of patient's allergies indicates no known allergies.  Home Medications   Current Outpatient Rx  Name  Route  Sig  Dispense  Refill  . HYDROCODONE-ACETAMINOPHEN 5-325 MG PO TABS   Oral   Take 1 tablet by mouth every 6 (six) hours as needed for pain.   20 tablet   0   .  PREDNISONE (PAK) 10 MG PO TABS      Take 6 tabs day 1, 5 tabs day 2, 4 tabs day 3, 3 tabs day 4, 2 tabs day 5, 1 tab day 6   21 tablet   0   . SULFAMETHOXAZOLE-TRIMETHOPRIM 800-160 MG PO TABS   Oral   Take 1 tablet by mouth every 12 (twelve) hours.   20 tablet   0     BP 150/104  Pulse 99  Temp 98.1 F (36.7 C) (Oral)  Resp 18  SpO2 100%  LMP 10/12/2012  Physical Exam  Nursing note and vitals reviewed. Constitutional: She is oriented to person, place, and time. She appears well-developed and well-nourished. No distress.  HENT:  Head: Normocephalic and atraumatic.  Eyes: EOM are normal.  Neck: Neck supple. No tracheal deviation present.  Cardiovascular: Normal rate.   Pulmonary/Chest: Effort normal. No respiratory distress.  Musculoskeletal: Normal range of motion.       No swelling in right arm No ecchymosis of right arm No deformity of right arm Pt is significantly tender in right bicep Not able to palpate any masses in right arm Left arm nl Left hand dominant  Neurological: She is alert and oriented to person, place, and time.  Skin: Skin is warm and dry.  Psychiatric: She has a normal mood and affect. Her behavior is normal.    ED Course  Procedures (including critical care time) DIAGNOSTIC STUDIES: Oxygen Saturation is 100% on room air, normal by my interpretation.    COORDINATION OF CARE: 2:36 PM Discussed ED treatment with pt  2:40 PM Ordered:    .  morphine injection  4 mg Intramuscular Once       Labs Reviewed - No data to display No results found.   No diagnosis found.    MDM  Arm pain, chronic since fall a few months ago.  Possible biceps tear.         I personally performed the services described in this documentation, which was scribed in my presence. The recorded information has been reviewed and is accurate.    Kristen Guppy, MD 11/02/12 1444

## 2012-11-02 NOTE — ED Notes (Signed)
Pt presents to department for evaluation of chronic R arm pain. Ongoing for several months, but states pain has become worse x4 days. 10/10 pain at the time. Able to move extremity, no swelling noted. States she ran out of pain medication. She is conscious alert and oriented x4. Tearful upon arrival to triage.

## 2012-11-03 ENCOUNTER — Ambulatory Visit (HOSPITAL_COMMUNITY)
Admission: RE | Admit: 2012-11-03 | Discharge: 2012-11-03 | Disposition: A | Discharge status: Home / Self Care | Payer: Self-pay | Source: Ambulatory Visit | Attending: Neurosurgery | Admitting: Neurosurgery

## 2012-11-03 DIAGNOSIS — M79609 Pain in unspecified limb: Secondary | ICD-10-CM | POA: Insufficient documentation

## 2012-11-03 DIAGNOSIS — M5020 Other cervical disc displacement, unspecified cervical region: Secondary | ICD-10-CM | POA: Insufficient documentation

## 2012-11-09 ENCOUNTER — Other Ambulatory Visit: Payer: Self-pay | Admitting: Neurosurgery

## 2012-12-01 ENCOUNTER — Encounter (HOSPITAL_COMMUNITY)
Admission: RE | Admit: 2012-12-01 | Discharge: 2012-12-01 | Disposition: A | Discharge status: Home / Self Care | Payer: Self-pay | Source: Ambulatory Visit | Attending: Neurosurgery | Admitting: Neurosurgery

## 2012-12-01 ENCOUNTER — Encounter (HOSPITAL_COMMUNITY): Payer: Self-pay | Admitting: Pharmacy Technician

## 2012-12-01 ENCOUNTER — Encounter (HOSPITAL_COMMUNITY): Payer: Self-pay

## 2012-12-01 ENCOUNTER — Encounter (HOSPITAL_COMMUNITY)
Admission: RE | Admit: 2012-12-01 | Discharge: 2012-12-01 | Disposition: A | Discharge status: Home / Self Care | Payer: Self-pay | Source: Ambulatory Visit | Attending: Anesthesiology | Admitting: Anesthesiology

## 2012-12-01 HISTORY — DX: Unspecified convulsions: R56.9

## 2012-12-01 HISTORY — DX: Anemia, unspecified: D64.9

## 2012-12-01 HISTORY — DX: Cervicalgia: M54.2

## 2012-12-01 HISTORY — DX: Urinary tract infection, site not specified: N39.0

## 2012-12-01 HISTORY — DX: Other constipation: K59.09

## 2012-12-01 HISTORY — DX: Shortness of breath: R06.02

## 2012-12-01 HISTORY — DX: Other chronic pain: G89.29

## 2012-12-01 LAB — SURGICAL PCR SCREEN: Staphylococcus aureus: NEGATIVE

## 2012-12-01 LAB — CBC
HCT: 34 % — ABNORMAL LOW (ref 36.0–46.0)
Hemoglobin: 11.6 g/dL — ABNORMAL LOW (ref 12.0–15.0)
MCH: 29.8 pg (ref 26.0–34.0)
RBC: 3.89 MIL/uL (ref 3.87–5.11)

## 2012-12-01 LAB — BASIC METABOLIC PANEL
BUN: 7 mg/dL (ref 6–23)
CO2: 24 mEq/L (ref 19–32)
Calcium: 9.6 mg/dL (ref 8.4–10.5)
Glucose, Bld: 87 mg/dL (ref 70–99)
Sodium: 138 mEq/L (ref 135–145)

## 2012-12-01 NOTE — Pre-Procedure Instructions (Signed)
22 [name]  12/01/2012   Your procedure is scheduled on:  Friday December 09, 2012  Report to [facility] at 5:30 AM.  Call this number if you have problems the morning of surgery: [phone]   Remember:   Do not eat food or drink After Midnight.    Take these medicines the morning of surgery with A SIP OF WATER: oxycodone,    Do not wear jewelry, make-up or nail polish.  Do not wear lotions, powders, or perfumes.  Do not shave 48 hours prior to surgery.  Do not bring valuables to the hospital.  Contacts, dentures or bridgework may not be worn into surgery.  Leave suitcase in the car. After surgery it may be brought to your room.  For patients admitted to the hospital, checkout time is 11:00 AM the day of discharge.   Patients discharged the day of surgery will not be allowed to drive home.  Name and phone number of your driver: family / friend  Special Instructions: Shower using CHG 2 nights before surgery and the night before surgery.  If you shower the day of surgery use CHG.  Use special wash - you have one bottle of CHG for all showers.  You should use approximately 1/3 of the bottle for each shower.   Please read over the following fact sheets that you were given: Pain Booklet, Coughing and Deep Breathing, MRSA Information and Surgical Site Infection Prevention

## 2012-12-02 NOTE — Consult Note (Signed)
Anesthesia chart review: Patient is a 41 year old female scheduled for C5-6 ACDF on 12/09/12 by Dr. Gerlene Fee.  History includes smoking, asthma, bronchitis, anemia, history of seizures (not currently requiring treatment), depression, prior abdominal surgery.  Of note, she was admitted in April 2012 for evaluation of chest pain and depression/anxiety.  She ruled out for MI by enzymes and PE by CTA.Marland Kitchen  Her echo was WNL.  Her symptoms were felt to be due to anxiety or costochondritis.  No additional testing was recommended at that time.  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  Overall, I think it is stable since at least 08/29/03.    Echo on 04/05/2011 showed normal LV cavity size. Mild physical basal hypertrophy of the septum. Normal systolic function. Estimated EF 55-60%. Normal wall motion. No regional wall abnormalities. Left ventricular diastolic function parameters were normal. Trivial mitral regurgitation. Trivial pulmonic regurgitation.  Trivial tricuspid regurgitation.  Chest x-ray on 12/01/2012 showed no active disease.  Preoperative labs noted.  If no acute CV symptoms or change in her status then anticipate she can proceed as planned.  Shonna Chock, PA-C 12/02/12 1335

## 2012-12-08 MED ORDER — DEXAMETHASONE SODIUM PHOSPHATE 10 MG/ML IJ SOLN
10.0000 mg | INTRAMUSCULAR | Status: AC
  Administered 2012-12-09: 10 mg via INTRAVENOUS
  Filled 2012-12-08: qty 1

## 2012-12-08 MED ORDER — CEFAZOLIN SODIUM-DEXTROSE 2-3 GM-% IV SOLR
2.0000 g | INTRAVENOUS | Status: AC
  Administered 2012-12-09: 2 g via INTRAVENOUS
  Filled 2012-12-08: qty 50

## 2012-12-09 ENCOUNTER — Encounter (HOSPITAL_COMMUNITY): Payer: Self-pay | Admitting: Vascular Surgery

## 2012-12-09 ENCOUNTER — Ambulatory Visit (HOSPITAL_COMMUNITY): Payer: Self-pay

## 2012-12-09 ENCOUNTER — Encounter (HOSPITAL_COMMUNITY)
Admission: RE | Disposition: A | Discharge status: Home / Self Care | Payer: Self-pay | Source: Ambulatory Visit | Attending: Neurosurgery

## 2012-12-09 ENCOUNTER — Encounter (HOSPITAL_COMMUNITY): Payer: Self-pay | Admitting: Surgery

## 2012-12-09 ENCOUNTER — Ambulatory Visit (HOSPITAL_COMMUNITY): Payer: Self-pay | Admitting: Vascular Surgery

## 2012-12-09 ENCOUNTER — Ambulatory Visit (HOSPITAL_COMMUNITY)
Admission: RE | Admit: 2012-12-09 | Discharge: 2012-12-10 | Disposition: A | Discharge status: Home / Self Care | Payer: MEDICAID | Source: Ambulatory Visit | Attending: Neurosurgery | Admitting: Neurosurgery

## 2012-12-09 DIAGNOSIS — M4712 Other spondylosis with myelopathy, cervical region: Secondary | ICD-10-CM | POA: Insufficient documentation

## 2012-12-09 DIAGNOSIS — Z01818 Encounter for other preprocedural examination: Secondary | ICD-10-CM | POA: Insufficient documentation

## 2012-12-09 DIAGNOSIS — Z981 Arthrodesis status: Secondary | ICD-10-CM

## 2012-12-09 DIAGNOSIS — M5000 Cervical disc disorder with myelopathy, unspecified cervical region: Secondary | ICD-10-CM | POA: Insufficient documentation

## 2012-12-09 DIAGNOSIS — F17200 Nicotine dependence, unspecified, uncomplicated: Secondary | ICD-10-CM | POA: Insufficient documentation

## 2012-12-09 DIAGNOSIS — Z01812 Encounter for preprocedural laboratory examination: Secondary | ICD-10-CM | POA: Insufficient documentation

## 2012-12-09 HISTORY — PX: ANTERIOR CERVICAL DECOMP/DISCECTOMY FUSION: SHX1161

## 2012-12-09 SURGERY — ANTERIOR CERVICAL DECOMPRESSION/DISCECTOMY FUSION 1 LEVEL
Anesthesia: General | Wound class: Clean

## 2012-12-09 MED ORDER — KCL IN DEXTROSE-NACL 20-5-0.45 MEQ/L-%-% IV SOLN
80.0000 mL/h | INTRAVENOUS | Status: DC
  Filled 2012-12-09 (×3): qty 1000

## 2012-12-09 MED ORDER — PHENOL 1.4 % MT LIQD
1.0000 | OROMUCOSAL | Status: DC | PRN
  Administered 2012-12-09: 1 via OROMUCOSAL
  Filled 2012-12-09: qty 177

## 2012-12-09 MED ORDER — HYDROMORPHONE HCL PF 1 MG/ML IJ SOLN
1.0000 mg | INTRAMUSCULAR | Status: DC | PRN
  Administered 2012-12-09: 1 mg via INTRAMUSCULAR
  Filled 2012-12-09: qty 1

## 2012-12-09 MED ORDER — LIDOCAINE HCL (CARDIAC) 20 MG/ML IV SOLN
INTRAVENOUS | Status: DC | PRN
  Administered 2012-12-09: 100 mg via INTRAVENOUS

## 2012-12-09 MED ORDER — SODIUM CHLORIDE 0.9 % IR SOLN
Status: DC | PRN
  Administered 2012-12-09: 09:00:00

## 2012-12-09 MED ORDER — PROPOFOL 10 MG/ML IV BOLUS
INTRAVENOUS | Status: DC | PRN
  Administered 2012-12-09: 200 mg via INTRAVENOUS

## 2012-12-09 MED ORDER — HYDROMORPHONE HCL PF 1 MG/ML IJ SOLN
INTRAMUSCULAR | Status: AC
  Filled 2012-12-09: qty 1

## 2012-12-09 MED ORDER — MIDAZOLAM HCL 5 MG/5ML IJ SOLN
INTRAMUSCULAR | Status: DC | PRN
  Administered 2012-12-09: 1 mg via INTRAVENOUS

## 2012-12-09 MED ORDER — SODIUM CHLORIDE 0.9 % IV SOLN
INTRAVENOUS | Status: AC
  Filled 2012-12-09: qty 500

## 2012-12-09 MED ORDER — SODIUM CHLORIDE 0.9 % IJ SOLN
3.0000 mL | Freq: Two times a day (BID) | INTRAMUSCULAR | Status: DC
  Administered 2012-12-09 (×2): 3 mL via INTRAVENOUS

## 2012-12-09 MED ORDER — MENTHOL 3 MG MT LOZG
1.0000 | LOZENGE | OROMUCOSAL | Status: DC | PRN
  Administered 2012-12-09: 3 mg via ORAL
  Filled 2012-12-09: qty 9

## 2012-12-09 MED ORDER — DEXAMETHASONE SODIUM PHOSPHATE 4 MG/ML IJ SOLN: 4.0000 mg | Freq: Four times a day (QID) | INTRAMUSCULAR | Status: AC

## 2012-12-09 MED ORDER — SODIUM CHLORIDE 0.9 % IJ SOLN: 3.0000 mL | INTRAMUSCULAR | Status: DC | PRN

## 2012-12-09 MED ORDER — DEXTROSE 5 % IV SOLN
INTRAVENOUS | Status: DC | PRN
  Administered 2012-12-09: 08:00:00 via INTRAVENOUS

## 2012-12-09 MED ORDER — NEOSTIGMINE METHYLSULFATE 1 MG/ML IJ SOLN
INTRAMUSCULAR | Status: DC | PRN
  Administered 2012-12-09: 3 mg via INTRAVENOUS

## 2012-12-09 MED ORDER — HYDROMORPHONE HCL PF 1 MG/ML IJ SOLN
0.2500 mg | INTRAMUSCULAR | Status: DC | PRN
  Administered 2012-12-09 (×4): 0.5 mg via INTRAVENOUS

## 2012-12-09 MED ORDER — ONDANSETRON HCL 4 MG/2ML IJ SOLN
4.0000 mg | INTRAMUSCULAR | Status: DC | PRN
  Administered 2012-12-09: 4 mg via INTRAVENOUS
  Filled 2012-12-09: qty 2

## 2012-12-09 MED ORDER — HEMOSTATIC AGENTS (NO CHARGE) OPTIME
TOPICAL | Status: DC | PRN
  Administered 2012-12-09: 1 via TOPICAL

## 2012-12-09 MED ORDER — OXYCODONE-ACETAMINOPHEN 5-325 MG PO TABS
1.0000 | ORAL_TABLET | ORAL | Status: DC | PRN
  Administered 2012-12-09 – 2012-12-10 (×5): 2 via ORAL
  Filled 2012-12-09 (×5): qty 2

## 2012-12-09 MED ORDER — BACITRACIN 50000 UNITS IM SOLR
INTRAMUSCULAR | Status: AC
  Filled 2012-12-09: qty 1

## 2012-12-09 MED ORDER — PHENYLEPHRINE HCL 10 MG/ML IJ SOLN
10.0000 mg | INTRAVENOUS | Status: DC | PRN
  Administered 2012-12-09: 40 ug/min via INTRAVENOUS

## 2012-12-09 MED ORDER — ROCURONIUM BROMIDE 100 MG/10ML IV SOLN
INTRAVENOUS | Status: DC | PRN
  Administered 2012-12-09: 50 mg via INTRAVENOUS

## 2012-12-09 MED ORDER — ACETAMINOPHEN 650 MG RE SUPP: 650.0000 mg | RECTAL | Status: DC | PRN

## 2012-12-09 MED ORDER — ONDANSETRON HCL 4 MG/2ML IJ SOLN: 4.0000 mg | Freq: Once | INTRAMUSCULAR | Status: DC | PRN

## 2012-12-09 MED ORDER — ARTIFICIAL TEARS OP OINT
TOPICAL_OINTMENT | OPHTHALMIC | Status: DC | PRN
  Administered 2012-12-09: 1 via OPHTHALMIC

## 2012-12-09 MED ORDER — CYCLOBENZAPRINE HCL 10 MG PO TABS
ORAL_TABLET | ORAL | Status: AC
  Filled 2012-12-09: qty 1

## 2012-12-09 MED ORDER — DEXAMETHASONE 4 MG PO TABS
4.0000 mg | ORAL_TABLET | Freq: Four times a day (QID) | ORAL | Status: AC
  Administered 2012-12-09 (×2): 4 mg via ORAL
  Filled 2012-12-09 (×2): qty 1

## 2012-12-09 MED ORDER — CEFAZOLIN SODIUM-DEXTROSE 2-3 GM-% IV SOLR
2.0000 g | Freq: Three times a day (TID) | INTRAVENOUS | Status: AC
  Administered 2012-12-09 (×2): 2 g via INTRAVENOUS
  Filled 2012-12-09 (×2): qty 50

## 2012-12-09 MED ORDER — GLYCOPYRROLATE 0.2 MG/ML IJ SOLN
INTRAMUSCULAR | Status: DC | PRN
  Administered 2012-12-09: 0.4 mg via INTRAVENOUS

## 2012-12-09 MED ORDER — THROMBIN 5000 UNITS EX SOLR
CUTANEOUS | Status: DC | PRN
  Administered 2012-12-09 (×2): 5000 [IU] via TOPICAL

## 2012-12-09 MED ORDER — CYCLOBENZAPRINE HCL 10 MG PO TABS
10.0000 mg | ORAL_TABLET | Freq: Three times a day (TID) | ORAL | Status: DC | PRN
  Administered 2012-12-09 (×2): 10 mg via ORAL
  Filled 2012-12-09: qty 1

## 2012-12-09 MED ORDER — FENTANYL CITRATE 0.05 MG/ML IJ SOLN
INTRAMUSCULAR | Status: DC | PRN
  Administered 2012-12-09 (×2): 25 ug via INTRAVENOUS
  Administered 2012-12-09: 150 ug via INTRAVENOUS

## 2012-12-09 MED ORDER — EPHEDRINE SULFATE 50 MG/ML IJ SOLN
INTRAMUSCULAR | Status: DC | PRN
  Administered 2012-12-09: 5 mg via INTRAVENOUS

## 2012-12-09 MED ORDER — ONDANSETRON HCL 4 MG/2ML IJ SOLN
INTRAMUSCULAR | Status: DC | PRN
  Administered 2012-12-09: 4 mg via INTRAVENOUS

## 2012-12-09 MED ORDER — SODIUM CHLORIDE 0.9 % IR SOLN
Status: DC | PRN
  Administered 2012-12-09: 1000 mL

## 2012-12-09 MED ORDER — LACTATED RINGERS IV SOLN
INTRAVENOUS | Status: DC | PRN
  Administered 2012-12-09 (×2): via INTRAVENOUS

## 2012-12-09 MED ORDER — ACETAMINOPHEN 325 MG PO TABS: 650.0000 mg | ORAL_TABLET | ORAL | Status: DC | PRN

## 2012-12-09 SURGICAL SUPPLY — 60 items
BAG DECANTER FOR FLEXI CONT (MISCELLANEOUS) ×2 IMPLANT
BENZOIN TINCTURE PRP APPL 2/3 (GAUZE/BANDAGES/DRESSINGS) ×2 IMPLANT
BIT DRILL INVIZIA (BIT) ×1 IMPLANT
BRUSH SCRUB EZ PLAIN DRY (MISCELLANEOUS) ×2 IMPLANT
CANISTER SUCTION 2500CC (MISCELLANEOUS) ×2 IMPLANT
CLOTH BEACON ORANGE TIMEOUT ST (SAFETY) ×2 IMPLANT
CONT SPEC 4OZ CLIKSEAL STRL BL (MISCELLANEOUS) ×2 IMPLANT
DERMABOND ADVANCED (GAUZE/BANDAGES/DRESSINGS) ×1
DERMABOND ADVANCED .7 DNX12 (GAUZE/BANDAGES/DRESSINGS) ×1 IMPLANT
DRAPE C-ARM 42X72 X-RAY (DRAPES) ×4 IMPLANT
DRAPE LAPAROTOMY 100X72 PEDS (DRAPES) ×2 IMPLANT
DRAPE MICROSCOPE ZEISS OPMI (DRAPES) ×2 IMPLANT
DRAPE POUCH INSTRU U-SHP 10X18 (DRAPES) ×2 IMPLANT
DRAPE SURG 17X23 STRL (DRAPES) ×4 IMPLANT
DRESSING TELFA 8X3 (GAUZE/BANDAGES/DRESSINGS) ×2 IMPLANT
DRILL BIT INVIZIA (BIT) ×2
ELECT COATED BLADE 2.86 ST (ELECTRODE) ×2 IMPLANT
ELECT REM PT RETURN 9FT ADLT (ELECTROSURGICAL) ×2
ELECTRODE REM PT RTRN 9FT ADLT (ELECTROSURGICAL) ×1 IMPLANT
GAUZE SPONGE 4X4 16PLY XRAY LF (GAUZE/BANDAGES/DRESSINGS) IMPLANT
GLOVE BIOGEL M 8.0 STRL (GLOVE) ×2 IMPLANT
GLOVE BIOGEL PI IND STRL 7.0 (GLOVE) ×2 IMPLANT
GLOVE BIOGEL PI INDICATOR 7.0 (GLOVE) ×2
GLOVE ECLIPSE 7.5 STRL STRAW (GLOVE) ×2 IMPLANT
GLOVE EXAM NITRILE LRG STRL (GLOVE) IMPLANT
GLOVE EXAM NITRILE MD LF STRL (GLOVE) ×2 IMPLANT
GLOVE EXAM NITRILE XL STR (GLOVE) IMPLANT
GLOVE EXAM NITRILE XS STR PU (GLOVE) IMPLANT
GLOVE SS BIOGEL STRL SZ 6.5 (GLOVE) ×2 IMPLANT
GLOVE SUPERSENSE BIOGEL SZ 6.5 (GLOVE) ×2
GOWN BRE IMP SLV AUR LG STRL (GOWN DISPOSABLE) ×4 IMPLANT
GOWN BRE IMP SLV AUR XL STRL (GOWN DISPOSABLE) ×2 IMPLANT
GOWN STRL REIN 2XL LVL4 (GOWN DISPOSABLE) IMPLANT
HEAD HALTER (SOFTGOODS) ×2 IMPLANT
KIT BASIN OR (CUSTOM PROCEDURE TRAY) ×2 IMPLANT
KIT ROOM TURNOVER OR (KITS) ×2 IMPLANT
NEEDLE SPNL 20GX3.5 QUINCKE YW (NEEDLE) ×2 IMPLANT
NS IRRIG 1000ML POUR BTL (IV SOLUTION) ×2 IMPLANT
PACK LAMINECTOMY NEURO (CUSTOM PROCEDURE TRAY) ×2 IMPLANT
PAD ARMBOARD 7.5X6 YLW CONV (MISCELLANEOUS) ×2 IMPLANT
PATTIES SURGICAL .25X.25 (GAUZE/BANDAGES/DRESSINGS) IMPLANT
PATTIES SURGICAL .75X.75 (GAUZE/BANDAGES/DRESSINGS) ×2 IMPLANT
PLATE INVIZIA 1 LEV 22MM (Plate) ×2 IMPLANT
PUTTY BONE GRAFT KIT 2.5ML (Bone Implant) ×2 IMPLANT
RUBBERBAND STERILE (MISCELLANEOUS) ×4 IMPLANT
SCREW SD FIXED 12MM (Screw) ×8 IMPLANT
SPACER TMS 11X14X6MM (Spacer) ×2 IMPLANT
SPONGE GAUZE 4X4 12PLY (GAUZE/BANDAGES/DRESSINGS) ×2 IMPLANT
SPONGE INTESTINAL PEANUT (DISPOSABLE) ×2 IMPLANT
SPONGE SURGIFOAM ABS GEL SZ50 (HEMOSTASIS) ×2 IMPLANT
STRIP CLOSURE SKIN 1/2X4 (GAUZE/BANDAGES/DRESSINGS) ×2 IMPLANT
SUT PDS AB 5-0 P3 18 (SUTURE) ×2 IMPLANT
SUT VIC AB 3-0 CP2 18 (SUTURE) ×2 IMPLANT
SYR 20ML ECCENTRIC (SYRINGE) IMPLANT
TAPE CLOTH SURG 4X10 WHT LF (GAUZE/BANDAGES/DRESSINGS) ×2 IMPLANT
TOOL MATCHSTK 3MM (MISCELLANEOUS) ×2 IMPLANT
TOWEL OR 17X24 6PK STRL BLUE (TOWEL DISPOSABLE) ×2 IMPLANT
TOWEL OR 17X26 10 PK STRL BLUE (TOWEL DISPOSABLE) ×2 IMPLANT
TRAP SPECIMEN MUCOUS 40CC (MISCELLANEOUS) ×2 IMPLANT
WATER STERILE IRR 1000ML POUR (IV SOLUTION) ×2 IMPLANT

## 2012-12-09 NOTE — Op Note (Signed)
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  [place]  After being placed in the supine position and 5 pounds halter traction the patient's neck was prepped and draped in the usual sterile fashion. Localizing fluoroscopy was used prior to incision to identify the appropriate level. Transverse incision was made in the right anterior neck started at the midline and headed towards the medial aspect of the sternocleidomastoid muscle. The platysma muscle was then incised transversely and the natural fascial plane between the strap muscles medially and the sternocleidomastoid laterally was identified and followed down to the anterior aspect of the cervical spine. The longus coli muscles were identified and split in the midline and stripped away bilaterally with Kitner dissection and unipolar coagulation. Self-retaining tract was placed for exposure and x-ray showed approach the appropriate level. Using a 15 blade the annulus the disc at C5-6 was incised. Using pituitary rongeurs and curettes approximately 90% of the disc material was removed. High-speed drill was used to widen the interspace and bony shavings were saved for use later in the case. At this time the microscope was draped brought into the field and used for the remainder of the case. Using microdissection technique the remainder of the disc material down to the posterior longitudinal ligament was removed. Ligament was incised transversely and the cut edges removed a Kerrison punch. Thorough decompression was then carried out on the spinal dura into the foramen bilaterally until the C6 nerve roots were well visualized well decompressed. Dose foraminal disc herniation on the right more symptomatically sinus and this was removed without  difficulty. At this time measurements were taken and a 6 mm lordotic graft was chosen. It was filled with a mixture of autologous bone morselized allograft and then irrigation was carried out followed by hemostasis the plug was impacted without difficulty. Proximal we showed to be in good position. An appropriately length in VISI a anterior cervical plate was then chosen and drill holes were placed followed by placing of 12 mm screws x4. Locking mechanism was rotated locked position and final fluoroscopy showed good position of the plates screws and plugs. Irrigation was carried out and any bleeding control proper coagulation. The was then closed with inverted Vicryl on the platysma muscle inverted 5-0 PDS in the subcuticular layer Steri-Strips on the skin. Shortness is soft collar applied and the patient was extubated and taken to recovery room in stable condition.

## 2012-12-09 NOTE — Anesthesia Preprocedure Evaluation (Addendum)
Anesthesia Evaluation  Patient identified by MRN, date of birth, ID band Patient awake    Reviewed: Allergy & Precautions, H&P , NPO status , Patient's Chart, lab work & pertinent test results  Airway Mallampati: I TM Distance: >3 FB Neck ROM: full    Dental   Pulmonary shortness of breath and with exertion, asthma , Current Smoker,          Cardiovascular negative cardio ROS  Rhythm:regular Rate:Normal     Neuro/Psych Seizures -,  negative psych ROS   GI/Hepatic negative GI ROS, (+)     substance abuse  cocaine use,   Endo/Other  negative endocrine ROS  Renal/GU negative Renal ROS     Musculoskeletal negative musculoskeletal ROS (+)   Abdominal   Peds  Hematology negative hematology ROS (+)   Anesthesia Other Findings   Reproductive/Obstetrics negative OB ROS                          Anesthesia Physical Anesthesia Plan  ASA: II  Anesthesia Plan: General   Post-op Pain Management:    Induction: Intravenous  Airway Management Planned: Oral ETT  Additional Equipment:   Intra-op Plan:   Post-operative Plan: Extubation in OR  Informed Consent: I have reviewed the patients History and Physical, chart, labs and discussed the procedure including the risks, benefits and alternatives for the proposed anesthesia with the patient or authorized representative who has indicated his/her understanding and acceptance.   Dental advisory given  Plan Discussed with: Anesthesiologist and Surgeon  Anesthesia Plan Comments:        Anesthesia Quick Evaluation

## 2012-12-09 NOTE — Anesthesia Procedure Notes (Signed)
Procedure Name: Intubation Date/Time: 12/09/2012 7:53 AM Performed by: Julianne Rice K Pre-anesthesia Checklist: Emergency Drugs available, Patient identified, Timeout performed, Suction available and Patient being monitored Patient Re-evaluated:Patient Re-evaluated prior to inductionOxygen Delivery Method: Circle system utilized Preoxygenation: Pre-oxygenation with 100% oxygen Intubation Type: IV induction Ventilation: Mask ventilation without difficulty Laryngoscope Size: Miller and 2 Grade View: Grade II Tube type: Oral Tube size: 7.5 mm Number of attempts: 1 Airway Equipment and Method: Stylet and LTA kit utilized Placement Confirmation: ETT inserted through vocal cords under direct vision,  breath sounds checked- equal and bilateral and positive ETCO2 Secured at: 21 cm Tube secured with: Tape Dental Injury: Teeth and Oropharynx as per pre-operative assessment

## 2012-12-09 NOTE — Plan of Care (Signed)
Problem: Consults Goal: Diagnosis - Spinal Surgery Outcome: Completed/Met Date Met:  12/09/12 Cervical Spine Fusion     

## 2012-12-09 NOTE — Progress Notes (Signed)
Pt admitted to Short Stay.  VSS.  Pt denies any SOB and/or chest pain.  Color pink. Will proceed to get pt ready for procedure.

## 2012-12-09 NOTE — Anesthesia Postprocedure Evaluation (Signed)
  Anesthesia Post-op Note  Patient: [name]  Procedure(s) Performed: Procedure(s) (LRB) with comments: ANTERIOR CERVICAL DECOMPRESSION/DISCECTOMY FUSION 1 LEVEL (N/A) - Anterior Cervical Decompression/Discectomy Fusion Cervical Five-Six   Patient Location: PACU and NICU  Anesthesia Type:General  Level of Consciousness: awake, alert , oriented and patient cooperative  Airway and Oxygen Therapy: Patient Spontanous Breathing  Post-op Pain: mild  Post-op Assessment: Post-op Vital signs reviewed, Patient's Cardiovascular Status Stable, Respiratory Function Stable, Patent Airway, No signs of Nausea or vomiting and Pain level controlled  Post-op Vital Signs: stable  Complications: No apparent anesthesia complications

## 2012-12-09 NOTE — Preoperative (Signed)
Beta Blockers   Reason not to administer Beta Blockers:Not Applicable 

## 2012-12-09 NOTE — H&P (Signed)
  [name] is an 41 y.o. female.   Chief Complaint: Neck and right arm pain HPI: The patient is a 41 year old female who presented with neck and right upper extremity discomfort. She underwent imaging studies which showed an abnormality at C5-6 with some high signal in the spinal cord consistent with a contusion. After discussing the options explained the situation the patient requested surgery and now comes for a C5-6 anterior cervical discectomy fusion and plating. I've had a long discussion with her regarding the risks and benefits of surgical intervention. The risks discussed include but are not limited to bleeding infection weakness numbness paralysis spinal fluid leak coma quadriplegia hoarseness and death. We have discussed alternative methods of therapy offered risks and benefits of nonintervention. She has had the opportunity to ask numerous questions and appears to understand. With this information in hand she has requested that we proceed with surgery.  Past Medical History  Diagnosis Date  . Asthma   . Bronchitis   . Shortness of breath   . Bronchitis     hx of  . Urinary tract infection     hx of  . Seizures     hx of, "not under treatment for at this time"  . Anemia   . Chronic constipation     hx of  . Chronic neck pain     Past Surgical History  Procedure Date  . Abdominal surgery     History reviewed. No pertinent family history. Social History:  reports that she has been smoking Cigarettes.  She has smoked for the past 8 years. She does not have any smokeless tobacco history on file. She reports that she drinks alcohol. She reports that she uses illicit drugs ("Crack" cocaine).  Allergies: No Known Allergies  Medications Prior to Admission  Medication Sig Dispense Refill  . naphazoline-glycerin (CLEAR EYES REDNESS RELIEF) 0.012-0.2 % SOLN Place 1 drop into both eyes daily as needed. For eye redness and irritation.      Marland Kitchen oxyCODONE-acetaminophen  (PERCOCET/ROXICET) 5-325 MG per tablet Take 1-2 tablets by mouth every 4 (four) hours as needed. For pain.        No results found for this or any previous visit (from the past 48 hour(s)). No results found.  significant for breathing issues  Blood pressure 131/83, pulse 62, temperature 98.4 F (36.9 C), temperature source Oral, resp. rate 18, last menstrual period 12/01/2012, SpO2 99.00%.  The patient is awake alert and oriented. His no facial asymmetry. Her gait is nonantalgic. Reflexes are somewhat increased. Her strength is intact as is sensation. 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 The plan is for C5-6 anterior cervical discectomy fusion and plating.  Reinaldo Meeker, MD 12/09/2012, 7:35 AM

## 2012-12-09 NOTE — Transfer of Care (Signed)
Immediate Anesthesia Transfer of Care Note  Patient: [name]  Procedure(s) Performed: Procedure(s) (LRB) with comments: ANTERIOR CERVICAL DECOMPRESSION/DISCECTOMY FUSION 1 LEVEL (N/A) - Anterior Cervical Decompression/Discectomy Fusion Cervical Five-Six   Patient Location: PACU  Anesthesia Type:General  Level of Consciousness: awake, alert  and oriented  Airway & Oxygen Therapy: Patient Spontanous Breathing and Patient connected to nasal cannula oxygen  Post-op Assessment: Report given to PACU RN and Post -op Vital signs reviewed and stable  Post vital signs: Reviewed and stable  Complications: No apparent anesthesia complications

## 2012-12-10 MED ORDER — OXYCODONE-ACETAMINOPHEN 5-325 MG PO TABS: 1.0000 | ORAL_TABLET | ORAL | Status: DC | PRN

## 2012-12-10 MED ORDER — CYCLOBENZAPRINE HCL 10 MG PO TABS: 10.0000 mg | ORAL_TABLET | Freq: Three times a day (TID) | ORAL | Status: DC | PRN

## 2012-12-10 NOTE — Discharge Summary (Signed)
Physician Discharge Summary  Patient ID: [name] MRN: [id number] DOB/AGE: 07/24/1971 41 y.o.  Admit date: 12/09/2012 Discharge date: 12/10/2012  Admission Diagnoses: Cervical spondylosis    Discharge Diagnoses: Same   Discharged Condition: good  Hospital Course: The patient was admitted on 12/09/2012 and taken to the operating room where the patient underwent ACDF. The patient tolerated the procedure well and was taken to the recovery room and then to the floor in stable condition. The hospital course was routine. There were no complications. The wound remained clean dry and intact. Pt had appropriate neck soreness. No complaints of arm pain or new N/T/W. The patient remained afebrile with stable vital signs, and tolerated a regular diet. The patient continued to increase activities, and pain was well controlled with oral pain medications.   Consults: None  Significant Diagnostic Studies:  Results for orders placed during the hospital encounter of 12/01/12  CBC      Component Value Range   WBC 4.6  4.0 - 10.5 K/uL   RBC 3.89  3.87 - 5.11 MIL/uL   Hemoglobin 11.6 (*) 12.0 - 15.0 g/dL   HCT 40.9 (*) 81.1 - 91.4 %   MCV 87.4  78.0 - 100.0 fL   MCH 29.8  26.0 - 34.0 pg   MCHC 34.1  30.0 - 36.0 g/dL   RDW 78.2  95.6 - 21.3 %   Platelets 306  150 - 400 K/uL  BASIC METABOLIC PANEL      Component Value Range   Sodium 138  135 - 145 mEq/L   Potassium 3.9  3.5 - 5.1 mEq/L   Chloride 104  96 - 112 mEq/L   CO2 24  19 - 32 mEq/L   Glucose, Bld 87  70 - 99 mg/dL   BUN 7  6 - 23 mg/dL   Creatinine, Ser 0.86  0.50 - 1.10 mg/dL   Calcium 9.6  8.4 - 57.8 mg/dL   GFR calc non Af Amer >90  >90 mL/min   GFR calc Af Amer >90  >90 mL/min  HCG, SERUM, QUALITATIVE      Component Value Range   Preg, Serum NEGATIVE  NEGATIVE  SURGICAL PCR SCREEN      Component Value Range   MRSA, PCR NEGATIVE  NEGATIVE   Staphylococcus aureus NEGATIVE  NEGATIVE    Dg Chest 2 View  12/01/2012   *RADIOLOGY REPORT*  Clinical Data: Preop for cervical decompression  CHEST - 2 VIEW  Comparison: 08/25/2011  Findings: Cardiomediastinal silhouette is stable.  No acute infiltrate or pleural effusion.  No pulmonary edema.  Bony thorax is stable.  IMPRESSION: No active disease.  No significant change.   Original Report Authenticated By: Natasha Mead, M.D.    Dg Cervical Spine 2-3 Views  12/09/2012  *RADIOLOGY REPORT*  Clinical Data: Neck pain  CERVICAL SPINE - 2-3 VIEW,DG C-ARM 1-60 MIN  Comparison: Multiple priors  Findings: C-arm films document C5-6 ACDF.  IMPRESSION: As above.   Original Report Authenticated By: Davonna Belling, M.D.    Dg C-arm 1-60 Min  12/09/2012  *RADIOLOGY REPORT*  Clinical Data: Neck pain  CERVICAL SPINE - 2-3 VIEW,DG C-ARM 1-60 MIN  Comparison: Multiple priors  Findings: C-arm films document C5-6 ACDF.  IMPRESSION: As above.   Original Report Authenticated By: Davonna Belling, M.D.     Antibiotics:  Anti-infectives     Start     Dose/Rate Route Frequency Ordered Stop   12/09/12 1600   ceFAZolin (ANCEF) IVPB 2 g/50 mL  premix        2 g 100 mL/hr over 30 Minutes Intravenous Every 8 hours 12/09/12 1114 12/09/12 2349   12/09/12 0849   bacitracin 50,000 Units in sodium chloride irrigation 0.9 % 500 mL irrigation  Status:  Discontinued          As needed 12/09/12 0850 12/09/12 0933   12/09/12 0707   bacitracin 29562 UNITS injection     Comments: Reece Packer: cabinet override         12/09/12 0707 12/09/12 1914   12/09/12 0600   ceFAZolin (ANCEF) IVPB 2 g/50 mL premix        2 g 100 mL/hr over 30 Minutes Intravenous On call to O.R. 12/08/12 1359 12/09/12 0737          Discharge Exam: Blood pressure 111/68, pulse 67, temperature 99.1 F (37.3 C), temperature source Oral, resp. rate 18, last menstrual period 12/01/2012, SpO2 96.00%. Neurologic: Grossly normal Incision clean dry and intact  Discharge Medications:     Medication List     As of 12/10/2012  9:28 AM      TAKE these medications         CLEAR EYES REDNESS RELIEF 0.012-0.2 % Soln   Generic drug: naphazoline-glycerin   Place 1 drop into both eyes daily as needed. For eye redness and irritation.      cyclobenzaprine 10 MG tablet   Commonly known as: FLEXERIL   Take 1 tablet (10 mg total) by mouth 3 (three) times daily as needed for muscle spasms.      oxyCODONE-acetaminophen 5-325 MG per tablet   Commonly known as: PERCOCET/ROXICET   Take 1-2 tablets by mouth every 4 (four) hours as needed for pain.        Disposition: Home  Final Dx: ACDF      Discharge Orders    Future Orders Please Complete By Expires   Diet - low sodium heart healthy      Increase activity slowly      Driving Restrictions      Comments:   2 weeks   Remove dressing in 48 hours      Call MD for:  temperature >100.4      Call MD for:  persistant nausea and vomiting      Call MD for:  severe uncontrolled pain      Call MD for:  redness, tenderness, or signs of infection (pain, swelling, redness, odor or green/yellow discharge around incision site)      Call MD for:  difficulty breathing, headache or visual disturbances            Signed: Fraser Busche S 12/10/2012, 9:28 AM

## 2012-12-12 ENCOUNTER — Encounter (HOSPITAL_COMMUNITY): Payer: Self-pay | Admitting: Neurosurgery

## 2013-03-06 ENCOUNTER — Emergency Department (HOSPITAL_COMMUNITY)
Admission: EM | Admit: 2013-03-06 | Discharge: 2013-03-06 | Disposition: A | Discharge status: Home / Self Care | Payer: Self-pay | Attending: Emergency Medicine | Admitting: Emergency Medicine

## 2013-03-06 ENCOUNTER — Encounter (HOSPITAL_COMMUNITY): Payer: Self-pay | Admitting: Emergency Medicine

## 2013-03-06 DIAGNOSIS — J45909 Unspecified asthma, uncomplicated: Secondary | ICD-10-CM | POA: Insufficient documentation

## 2013-03-06 DIAGNOSIS — Z87440 Personal history of urinary (tract) infections: Secondary | ICD-10-CM | POA: Insufficient documentation

## 2013-03-06 DIAGNOSIS — Z791 Long term (current) use of non-steroidal anti-inflammatories (NSAID): Secondary | ICD-10-CM | POA: Insufficient documentation

## 2013-03-06 DIAGNOSIS — K5900 Constipation, unspecified: Secondary | ICD-10-CM | POA: Insufficient documentation

## 2013-03-06 DIAGNOSIS — L02412 Cutaneous abscess of left axilla: Secondary | ICD-10-CM

## 2013-03-06 DIAGNOSIS — R6883 Chills (without fever): Secondary | ICD-10-CM | POA: Insufficient documentation

## 2013-03-06 DIAGNOSIS — IMO0002 Reserved for concepts with insufficient information to code with codable children: Secondary | ICD-10-CM | POA: Insufficient documentation

## 2013-03-06 DIAGNOSIS — Z862 Personal history of diseases of the blood and blood-forming organs and certain disorders involving the immune mechanism: Secondary | ICD-10-CM | POA: Insufficient documentation

## 2013-03-06 DIAGNOSIS — G8929 Other chronic pain: Secondary | ICD-10-CM | POA: Insufficient documentation

## 2013-03-06 DIAGNOSIS — F17200 Nicotine dependence, unspecified, uncomplicated: Secondary | ICD-10-CM | POA: Insufficient documentation

## 2013-03-06 DIAGNOSIS — Z8709 Personal history of other diseases of the respiratory system: Secondary | ICD-10-CM | POA: Insufficient documentation

## 2013-03-06 DIAGNOSIS — M542 Cervicalgia: Secondary | ICD-10-CM | POA: Insufficient documentation

## 2013-03-06 MED ORDER — OXYCODONE-ACETAMINOPHEN 5-325 MG PO TABS: 2.0000 | ORAL_TABLET | ORAL | Status: DC | PRN

## 2013-03-06 MED ORDER — FENTANYL CITRATE 0.05 MG/ML IJ SOLN
100.0000 ug | Freq: Once | INTRAMUSCULAR | Status: AC
  Administered 2013-03-06: 100 ug via NASAL
  Filled 2013-03-06: qty 2

## 2013-03-06 NOTE — ED Notes (Signed)
Pt c/o abscess under left axillary area with pain and swelling

## 2013-03-06 NOTE — ED Provider Notes (Signed)
History  This chart was scribed for Hurman Horn, MD by Ardeen Jourdain, ED Scribe. This patient was seen in room TR05C/TR05C and the patient's care was started at 1910.  CSN: [id number]  Arrival date & time 03/06/13  1442   None     Chief Complaint  Patient presents with  . Abscess     The history is provided by the patient. No language interpreter was used.    [name] is a 42 y.o. female with a h/o prior abscess who presents to the Emergency Department complaining of a gradually worsening, gradual onset, constant left axillary abscess that began a few days ago with associated chills, pain, redness and swelling. Pt denies fever, neck pain, sore throat, visual disturbance, CP, cough, SOB, abdominal pain, nausea, emesis, diarrhea, urinary symptoms, back pain, HA, weakness, numbness and rash as associated symptoms.  Pt is tearful during the exam.    Past Medical History  Diagnosis Date  . Asthma   . Bronchitis   . Shortness of breath   . Bronchitis     hx of  . Urinary tract infection     hx of  . Seizures     hx of, "not under treatment for at this time"  . Anemia   . Chronic constipation     hx of  . Chronic neck pain     Past Surgical History  Procedure Laterality Date  . Abdominal surgery    . Anterior cervical decomp/discectomy fusion  12/09/2012    Procedure: ANTERIOR CERVICAL DECOMPRESSION/DISCECTOMY FUSION 1 LEVEL;  Surgeon: Reinaldo Meeker, MD;  Location: [facility];  Service: Neurosurgery;  Laterality: N/A;  Anterior Cervical Decompression/Discectomy Fusion Cervical Five-Six     History reviewed. No pertinent family history.  History  Substance Use Topics  . Smoking status: Current Some Day Smoker -- 8 years    Types: Cigarettes  . Smokeless tobacco: Not on file     Comment: "off and on"  . Alcohol Use: Yes     Comment: "social"   No OB history available.   Review of Systems  10 Systems reviewed and all are negative for acute change except  as noted in the HPI.   Allergies  Review of patient's allergies indicates no known allergies.  Home Medications   Current Outpatient Rx  Name  Route  Sig  Dispense  Refill  . acetaminophen (TYLENOL) 500 MG tablet   Oral   Take 1,000 mg by mouth every 6 (six) hours as needed for pain.         . naphazoline-glycerin (CLEAR EYES REDNESS RELIEF) 0.012-0.2 % SOLN   Both Eyes   Place 1 drop into both eyes daily as needed. For eye redness and irritation.         . Vitamins A & D (VITAMIN A & D) ointment   Topical   Apply 1 application topically daily as needed for dry skin.         Marland Kitchen oxyCODONE-acetaminophen (PERCOCET) 5-325 MG per tablet   Oral   Take 2 tablets by mouth every 4 (four) hours as needed for pain.   20 tablet   0     Triage Vitals: BP 146/105  Pulse 84  Temp(Src) 98.7 F (37.1 C) (Oral)  Resp 18  SpO2 100%  Physical Exam  Nursing note and vitals reviewed. Constitutional:  Awake, alert, nontoxic appearance.  HENT:  Head: Atraumatic.  Eyes: Right eye exhibits no discharge. Left eye exhibits no  discharge.  Neck: Neck supple.  Cardiovascular: Normal rate, regular rhythm and normal heart sounds.  Exam reveals no gallop and no friction rub.   No murmur heard. Pulmonary/Chest: Effort normal and breath sounds normal. No respiratory distress. She has no wheezes. She has no rales. She exhibits no tenderness.  Abdominal: Soft. There is no tenderness. There is no rebound.  Musculoskeletal: She exhibits no tenderness.  Baseline ROM, no obvious new focal weakness.  Neurological:  Mental status and motor strength appears baseline for patient and situation.  Skin: No rash noted.  4 by 2 cm abscess to left axilla with tenderness, fluctuance and erythema   Psychiatric: She has a normal mood and affect.  limited bedside US confirmed subcut fluid collection suggestive of abscess left axilla  ED Course  Procedures (including critical care time) INCISION AND  DRAINAGE Performed by: Hurman Horn Consent: Verbal consent obtained. Risks and benefits: risks, benefits and alternatives were discussed Time out performed prior to procedure Type: abscess Body area: left axilla Anesthesia: local infiltration 2% lidocaine with epinepfrine 10ml Incision was made with a scalpel. Local anesthetic: lidocaine 2% with epinephrine Anesthetic total: 10 ml Complexity: complex Blunt dissection to break up loculations Drainage: purulent Drainage amount: copious Packing material: none Patient tolerance: Patient tolerated the procedure well with no immediate complications.   DIAGNOSTIC STUDIES: Oxygen Saturation is 100% on room air, normal by my interpretation.    COORDINATION OF CARE:  7:23 PM: Patient / Family / Caregiver informed of clinical course, understand medical decision-making process, and agree with plan.    Labs Reviewed - No data to display No results found.   1. Abscess of left axilla       MDM  I personally performed the services described in this documentation, which was scribed in my presence. The recorded information has been reviewed and is accurate.    Hurman Horn, MD 03/10/13 [phone]

## 2013-04-20 IMAGING — CR DG CHEST 2V
2 series · 2 of 2 positions shown · non-contrast
Comparison: 08/25/2011

CLINICAL DATA: Preop for cervical decompression

CHEST - 2 VIEW

[view not recorded (1 of 2)]
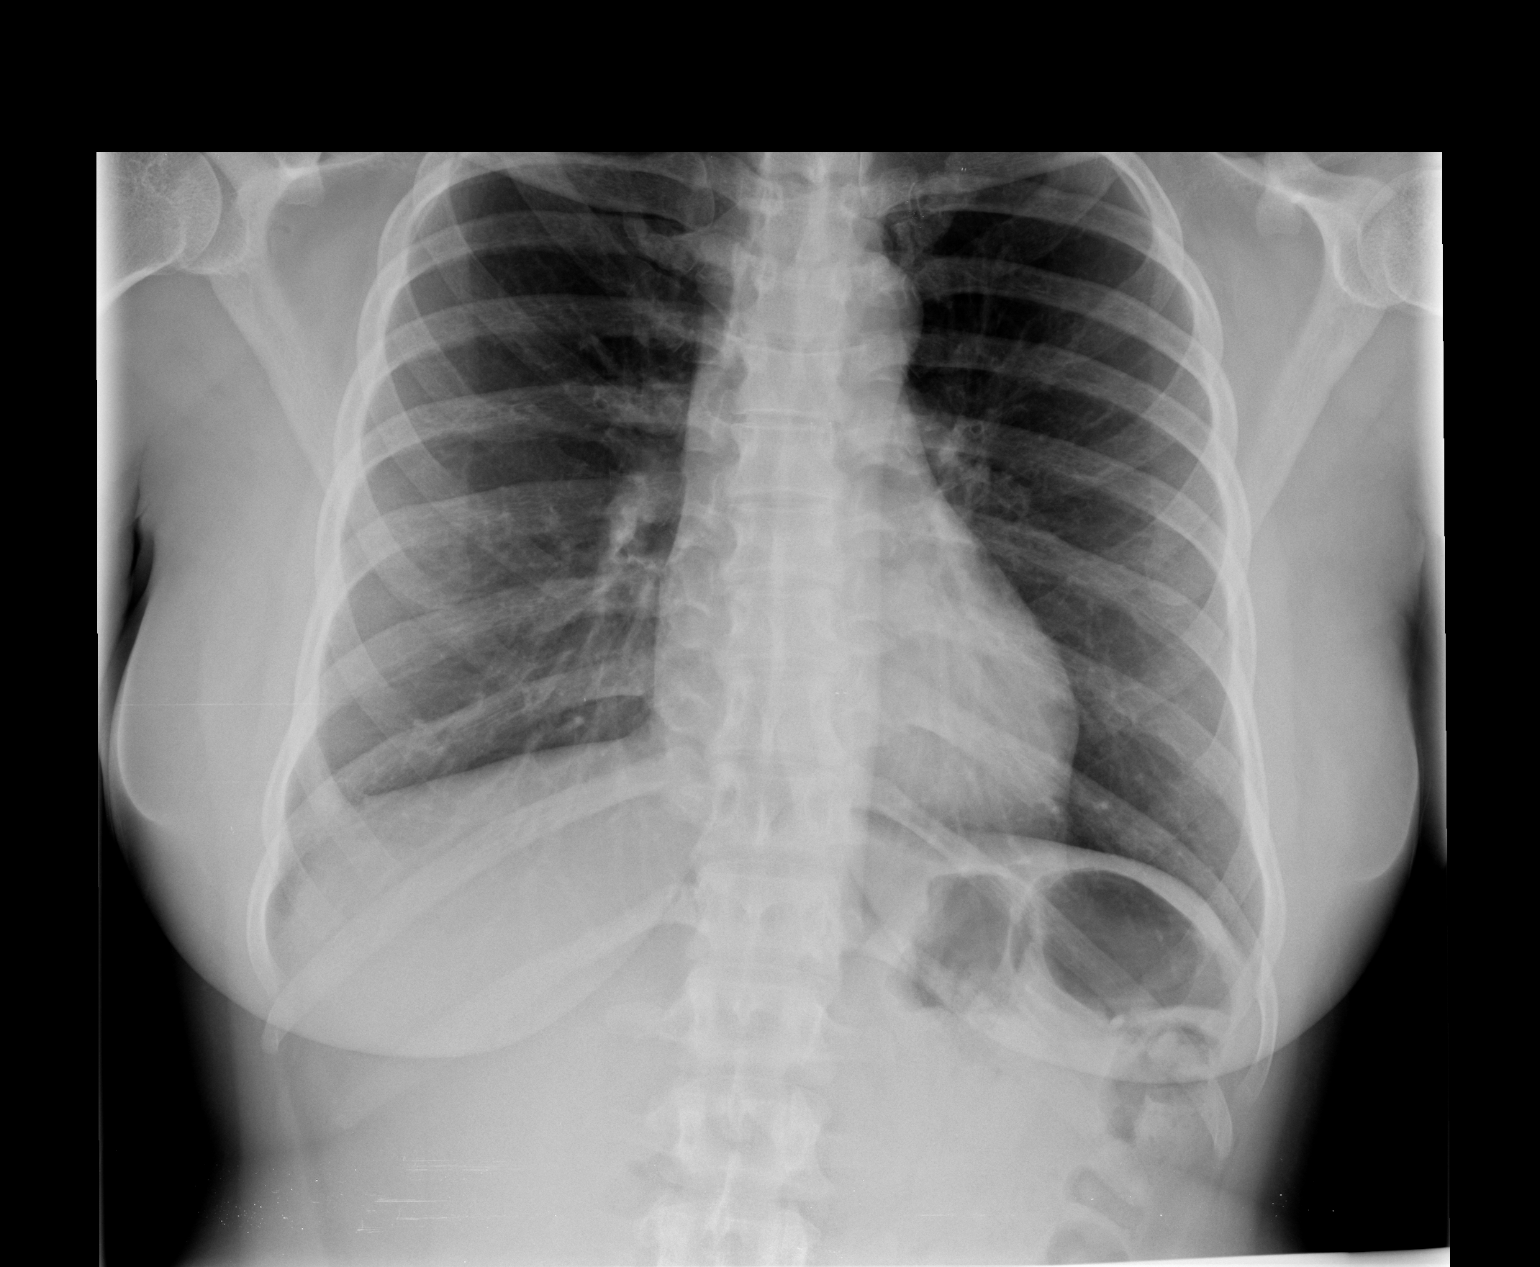

[view not recorded (2 of 2)]
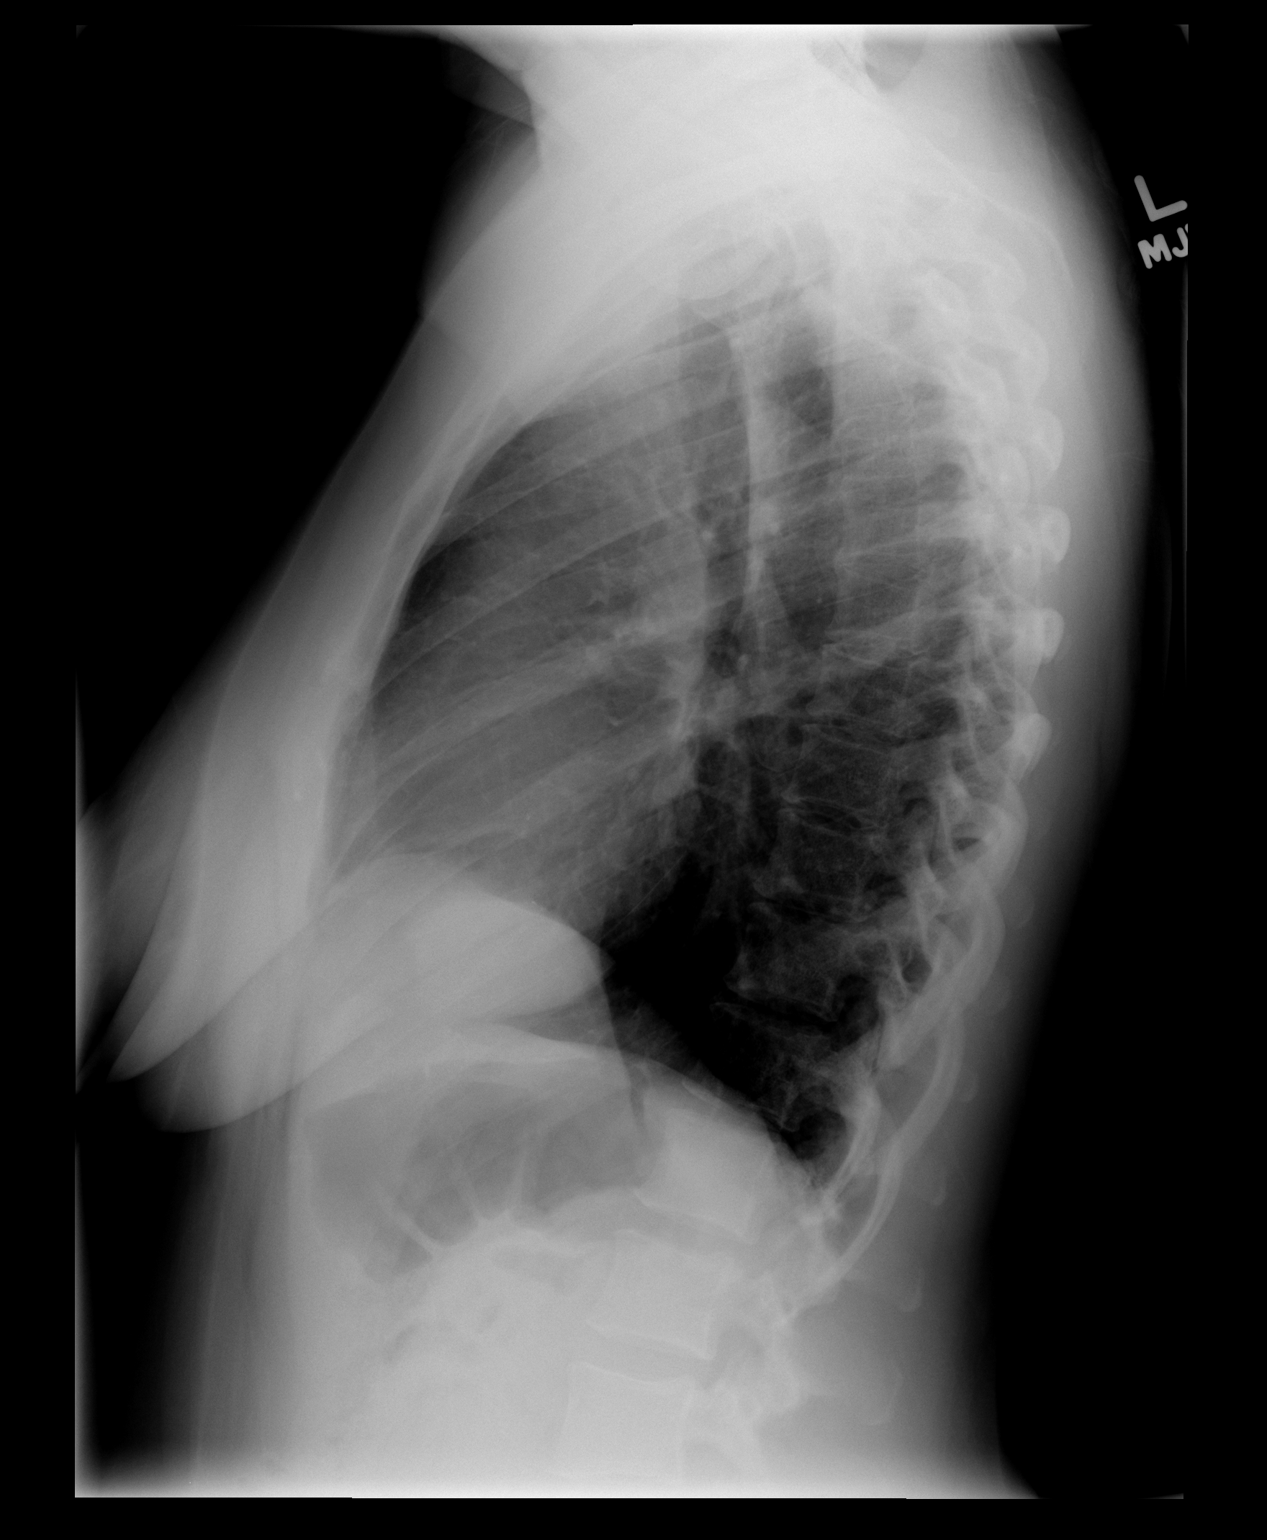

[2 of 2 positions shown; findings below may reference images not displayed]

FINDINGS: Cardiomediastinal silhouette is stable.  No acute
infiltrate or pleural effusion.  No pulmonary edema.  Bony thorax
is stable.
IMPRESSION: No active disease.  No significant change.

## 2015-04-24 ENCOUNTER — Emergency Department (HOSPITAL_COMMUNITY): Payer: PRIVATE HEALTH INSURANCE

## 2015-04-24 ENCOUNTER — Encounter (HOSPITAL_COMMUNITY): Payer: Self-pay | Admitting: Emergency Medicine

## 2015-04-24 ENCOUNTER — Emergency Department (HOSPITAL_COMMUNITY)
Admission: EM | Admit: 2015-04-24 | Discharge: 2015-04-24 | Disposition: A | Discharge status: Home / Self Care | Payer: Self-pay | Attending: Emergency Medicine | Admitting: Emergency Medicine

## 2015-04-24 DIAGNOSIS — Z720 Tobacco use: Secondary | ICD-10-CM | POA: Insufficient documentation

## 2015-04-24 DIAGNOSIS — Z79899 Other long term (current) drug therapy: Secondary | ICD-10-CM | POA: Insufficient documentation

## 2015-04-24 DIAGNOSIS — Z9889 Other specified postprocedural states: Secondary | ICD-10-CM | POA: Insufficient documentation

## 2015-04-24 DIAGNOSIS — R109 Unspecified abdominal pain: Secondary | ICD-10-CM | POA: Insufficient documentation

## 2015-04-24 DIAGNOSIS — G8929 Other chronic pain: Secondary | ICD-10-CM | POA: Insufficient documentation

## 2015-04-24 DIAGNOSIS — Z862 Personal history of diseases of the blood and blood-forming organs and certain disorders involving the immune mechanism: Secondary | ICD-10-CM | POA: Insufficient documentation

## 2015-04-24 DIAGNOSIS — J45909 Unspecified asthma, uncomplicated: Secondary | ICD-10-CM | POA: Insufficient documentation

## 2015-04-24 DIAGNOSIS — Z8719 Personal history of other diseases of the digestive system: Secondary | ICD-10-CM | POA: Insufficient documentation

## 2015-04-24 DIAGNOSIS — Z87440 Personal history of urinary (tract) infections: Secondary | ICD-10-CM | POA: Insufficient documentation

## 2015-04-24 DIAGNOSIS — Z3202 Encounter for pregnancy test, result negative: Secondary | ICD-10-CM | POA: Insufficient documentation

## 2015-04-24 LAB — URINE MICROSCOPIC-ADD ON

## 2015-04-24 LAB — URINALYSIS, ROUTINE W REFLEX MICROSCOPIC
Bilirubin Urine: NEGATIVE
Glucose, UA: NEGATIVE mg/dL
Ketones, ur: NEGATIVE mg/dL
NITRITE: NEGATIVE
PROTEIN: 30 mg/dL — AB
SPECIFIC GRAVITY, URINE: 1.008 (ref 1.005–1.030)
UROBILINOGEN UA: 1 mg/dL (ref 0.0–1.0)
pH: 8 (ref 5.0–8.0)

## 2015-04-24 LAB — CBC WITH DIFFERENTIAL/PLATELET
BASOS ABS: 0 10*3/uL (ref 0.0–0.1)
Basophils Relative: 0 % (ref 0–1)
EOS ABS: 0.1 10*3/uL (ref 0.0–0.7)
Eosinophils Relative: 1 % (ref 0–5)
HCT: 35.5 % — ABNORMAL LOW (ref 36.0–46.0)
Hemoglobin: 11.3 g/dL — ABNORMAL LOW (ref 12.0–15.0)
LYMPHS ABS: 2.2 10*3/uL (ref 0.7–4.0)
LYMPHS PCT: 40 % (ref 12–46)
MCH: 27.4 pg (ref 26.0–34.0)
MCHC: 31.8 g/dL (ref 30.0–36.0)
MCV: 86 fL (ref 78.0–100.0)
Monocytes Absolute: 0.3 10*3/uL (ref 0.1–1.0)
Monocytes Relative: 6 % (ref 3–12)
NEUTROS PCT: 53 % (ref 43–77)
Neutro Abs: 2.9 10*3/uL (ref 1.7–7.7)
PLATELETS: 386 10*3/uL (ref 150–400)
RBC: 4.13 MIL/uL (ref 3.87–5.11)
RDW: 16.1 % — AB (ref 11.5–15.5)
WBC: 5.4 10*3/uL (ref 4.0–10.5)

## 2015-04-24 LAB — BASIC METABOLIC PANEL
ANION GAP: 5 (ref 5–15)
BUN: 8 mg/dL (ref 6–20)
CALCIUM: 9.1 mg/dL (ref 8.9–10.3)
CHLORIDE: 103 mmol/L (ref 101–111)
CO2: 27 mmol/L (ref 22–32)
Creatinine, Ser: 0.67 mg/dL (ref 0.44–1.00)
GLUCOSE: 109 mg/dL — AB (ref 70–99)
POTASSIUM: 4.2 mmol/L (ref 3.5–5.1)
SODIUM: 135 mmol/L (ref 135–145)

## 2015-04-24 LAB — PREGNANCY, URINE: PREG TEST UR: NEGATIVE

## 2015-04-24 MED ORDER — OXYCODONE-ACETAMINOPHEN 5-325 MG PO TABS
2.0000 | ORAL_TABLET | Freq: Once | ORAL | Status: AC
  Administered 2015-04-24: 2 via ORAL
  Filled 2015-04-24: qty 2

## 2015-04-24 MED ORDER — IBUPROFEN 800 MG PO TABS: 800.0000 mg | ORAL_TABLET | Freq: Three times a day (TID) | ORAL | Status: DC

## 2015-04-24 MED ORDER — ONDANSETRON 4 MG PO TBDP
4.0000 mg | ORAL_TABLET | Freq: Once | ORAL | Status: AC
  Administered 2015-04-24: 4 mg via ORAL
  Filled 2015-04-24: qty 1

## 2015-04-24 MED ORDER — HYDROCODONE-ACETAMINOPHEN 5-325 MG PO TABS: 2.0000 | ORAL_TABLET | ORAL | Status: DC | PRN

## 2015-04-24 MED ORDER — METHOCARBAMOL 500 MG PO TABS: 500.0000 mg | ORAL_TABLET | Freq: Two times a day (BID) | ORAL | Status: DC

## 2015-04-24 NOTE — ED Provider Notes (Signed)
CSN: [id number]     Arrival date & time 04/24/15  1200 History   First MD Initiated Contact with Patient 04/24/15 1500     Chief Complaint  Patient presents with  . Abdominal Pain     (Consider location/radiation/quality/duration/timing/severity/associated sxs/prior Treatment) Patient is a 44 y.o. female presenting with abdominal pain. The history is provided by the patient. No language interpreter was used.  Abdominal Pain Pain location:  R flank Pain quality: aching   Pain radiates to:  Does not radiate Pain severity:  Moderate Onset quality:  Gradual Timing:  Constant Progression:  Worsening Chronicity:  New Relieved by:  Nothing Worsened by:  Nothing tried Ineffective treatments:  None tried Associated symptoms: nausea   Associated symptoms: no constipation, no vaginal discharge and no vomiting   Risk factors: has not had multiple surgeries     Past Medical History  Diagnosis Date  . Asthma   . Bronchitis   . Shortness of breath   . Bronchitis     hx of  . Urinary tract infection     hx of  . Seizures     hx of, "not under treatment for at this time"  . Anemia   . Chronic constipation     hx of  . Chronic neck pain    Past Surgical History  Procedure Laterality Date  . Abdominal surgery    . Anterior cervical decomp/discectomy fusion  12/09/2012    Procedure: ANTERIOR CERVICAL DECOMPRESSION/DISCECTOMY FUSION 1 LEVEL;  Surgeon: Reinaldo Meekerandy O Kritzer, MD;  Location: [facility];  Service: Neurosurgery;  Laterality: N/A;  Anterior Cervical Decompression/Discectomy Fusion Cervical Five-Six    No family history on file. History  Substance Use Topics  . Smoking status: Current Some Day Smoker -- 8 years    Types: Cigarettes  . Smokeless tobacco: Not on file     Comment: "off and on"  . Alcohol Use: Yes     Comment: "social"   OB History    No data available     Review of Systems  Gastrointestinal: Positive for nausea and abdominal pain. Negative for vomiting  and constipation.  Genitourinary: Negative for vaginal discharge.  All other systems reviewed and are negative.     Allergies  Review of patient's allergies indicates no known allergies.  Home Medications   Prior to Admission medications   Medication Sig Start Date End Date Taking? Authorizing Provider  diphenhydramine-acetaminophen (TYLENOL PM) 25-500 MG TABS Take 0.5 tablets by mouth once.   Yes Historical Provider, MD  sodium chloride (OCEAN) 0.65 % SOLN nasal spray Place 2 sprays into both nostrils daily as needed for congestion.   Yes Historical Provider, MD  Tetrahydrozoline HCl (VISINE OP) Apply 1-2 drops to eye daily as needed (dry eyes.).   Yes Historical Provider, MD  oxyCODONE-acetaminophen (PERCOCET) 5-325 MG per tablet Take 2 tablets by mouth every 4 (four) hours as needed for pain. Patient not taking: Reported on 04/24/2015 03/06/13   Wayland SalinasJohn Bednar, MD   BP 143/97 mmHg  Pulse 83  Temp(Src) 98 F (36.7 C) (Oral)  Resp 18  SpO2 100%  LMP 04/23/2015 Physical Exam  Constitutional: She is oriented to person, place, and time. She appears well-developed and well-nourished.  HENT:  Head: Normocephalic.  Right Ear: External ear normal.  Mouth/Throat: Oropharynx is clear and moist.  Eyes: Conjunctivae and EOM are normal. Pupils are equal, round, and reactive to light.  Neck: Normal range of motion.  Cardiovascular: Normal rate and normal heart sounds.  Pulmonary/Chest: Effort normal.  Abdominal: Soft. She exhibits no distension. There is no guarding.  Musculoskeletal: Normal range of motion.  Neurological: She is alert and oriented to person, place, and time.  Skin: Skin is warm.  Psychiatric: She has a normal mood and affect.  Nursing note and vitals reviewed.   ED Course  Procedures (including critical care time) Labs Review Labs Reviewed  CBC WITH DIFFERENTIAL/PLATELET - Abnormal; Notable for the following:    Hemoglobin 11.3 (*)    HCT 35.5 (*)    RDW 16.1 (*)     All other components within normal limits  BASIC METABOLIC PANEL - Abnormal; Notable for the following:    Glucose, Bld 109 (*)    All other components within normal limits  URINALYSIS, ROUTINE W REFLEX MICROSCOPIC    Imaging Review Ct Renal Stone Study  04/24/2015   CLINICAL DATA:  Abdominal and flank pain for 2-3 weeks.  EXAM: CT ABDOMEN AND PELVIS WITHOUT CONTRAST  TECHNIQUE: Multidetector CT imaging of the abdomen and pelvis was performed following the standard protocol without IV contrast.  COMPARISON:  None.  FINDINGS: Lower chest:  Unremarkable.  Hepatobiliary: No mass visualized on this unenhanced exam. Tiny calcified gallstones noted, however there is no evidence of cholecystitis or biliary dilatation.  Pancreas: No mass or inflammatory process visualized on this unenhanced exam.  Spleen:  Within normal limits in size.  Adrenal Glands:  No masses identified.  Kidneys/Urinary tract: No evidence of urolithiasis or hydronephrosis.  Stomach/Bowel/Peritoneum: Unremarkable. Normal appendix visualized.  Vascular/Lymphatic: No pathologically enlarged lymph nodes identified. No other significant abnormality identified.  Reproductive:  No mass or other significant abnormality noted.  Other:  None.  Musculoskeletal:  No suspicious bone lesions identified.  IMPRESSION: Cholelithiasis. No radiographic evidence of cholecystitis or other acute findings.  No evidence of urolithiasis or hydronephrosis.   Electronically Signed   By: Myles RosenthalJohn  Stahl M.D.   On: 04/24/2015 18:40     EKG Interpretation None      MDM   Final diagnoses:  Flank pain   Ct scan normal,  Urine shows blood,  Pt is on menses.  Back/flank pain is most likely muscular.   I will treat with robaxin and  Ibuprofen.  Pt advised to follow up with her Md for evaluation    Elson AreasLeslie K Ramez Arrona, Cordelia Poche-C 04/24/15 2312  Linwood DibblesJon Knapp, MD 04/24/15 [phone]

## 2015-04-24 NOTE — ED Notes (Signed)
Bed: WA05 Expected date:  Expected time:  Means of arrival:  Comments: seizure 

## 2015-04-24 NOTE — Discharge Instructions (Signed)
Back Pain, Adult Low back pain is very common. About 1 in 5 people have back pain.The cause of low back pain is rarely dangerous. The pain often gets better over time.About half of people with a sudden onset of back pain feel better in just 2 weeks. About 8 in 10 people feel better by 6 weeks.  CAUSES Some common causes of back pain include:  Strain of the muscles or ligaments supporting the spine.  Wear and tear (degeneration) of the spinal discs.  Arthritis.  Direct injury to the back. DIAGNOSIS Most of the time, the direct cause of low back pain is not known.However, back pain can be treated effectively even when the exact cause of the pain is unknown.Answering your caregiver's questions about your overall health and symptoms is one of the most accurate ways to make sure the cause of your pain is not dangerous. If your caregiver needs more information, he or she may order lab work or imaging tests (X-rays or MRIs).However, even if imaging tests show changes in your back, this usually does not require surgery. HOME CARE INSTRUCTIONS For many people, back pain returns.Since low back pain is rarely dangerous, it is often a condition that people can learn to manageon their own.   Remain active. It is stressful on the back to sit or stand in one place. Do not sit, drive, or stand in one place for more than 30 minutes at a time. Take short walks on level surfaces as soon as pain allows.Try to increase the length of time you walk each day.  Do not stay in bed.Resting more than 1 or 2 days can delay your recovery.  Do not avoid exercise or work.Your body is made to move.It is not dangerous to be active, even though your back may hurt.Your back will likely heal faster if you return to being active before your pain is gone.  Pay attention to your body when you bend and lift. Many people have less discomfortwhen lifting if they bend their knees, keep the load close to their bodies,and  avoid twisting. Often, the most comfortable positions are those that put less stress on your recovering back.  Find a comfortable position to sleep. Use a firm mattress and lie on your side with your knees slightly bent. If you lie on your back, put a pillow under your knees.  Only take over-the-counter or prescription medicines as directed by your caregiver. Over-the-counter medicines to reduce pain and inflammation are often the most helpful.Your caregiver may prescribe muscle relaxant drugs.These medicines help dull your pain so you can more quickly return to your normal activities and healthy exercise.  Put ice on the injured area.  Put ice in a plastic bag.  Place a towel between your skin and the bag.  Leave the ice on for 15-20 minutes, 03-04 times a day for the first 2 to 3 days. After that, ice and heat may be alternated to reduce pain and spasms.  Ask your caregiver about trying back exercises and gentle massage. This may be of some benefit.  Avoid feeling anxious or stressed.Stress increases muscle tension and can worsen back pain.It is important to recognize when you are anxious or stressed and learn ways to manage it.Exercise is a great option. SEEK MEDICAL CARE IF:  You have pain that is not relieved with rest or medicine.  You have pain that does not improve in 1 week.  You have new symptoms.  You are generally not feeling well. SEEK   IMMEDIATE MEDICAL CARE IF:   You have pain that radiates from your back into your legs.  You develop new bowel or bladder control problems.  You have unusual weakness or numbness in your arms or legs.  You develop nausea or vomiting.  You develop abdominal pain.  You feel faint. Document Released: 11/30/2005 Document Revised: 05/31/2012 Document Reviewed: 04/03/2014 Exit[employer] Patient Information 2015 [employer]. This information is not intended to replace advice given to you by your health care provider. Make sure you  discuss any questions you have with your health care provider.  

## 2015-04-24 NOTE — ED Notes (Signed)
Per pt, multiple complaints-states abdominal pain, sinus infection, and back pain

## 2015-10-02 ENCOUNTER — Encounter (HOSPITAL_COMMUNITY): Payer: Self-pay | Admitting: *Deleted

## 2015-10-02 ENCOUNTER — Emergency Department (HOSPITAL_COMMUNITY)
Admission: EM | Admit: 2015-10-02 | Discharge: 2015-10-02 | Disposition: A | Discharge status: Home / Self Care | Payer: PRIVATE HEALTH INSURANCE | Attending: Emergency Medicine | Admitting: Emergency Medicine

## 2015-10-02 DIAGNOSIS — Z720 Tobacco use: Secondary | ICD-10-CM | POA: Insufficient documentation

## 2015-10-02 DIAGNOSIS — K029 Dental caries, unspecified: Secondary | ICD-10-CM | POA: Insufficient documentation

## 2015-10-02 DIAGNOSIS — J45909 Unspecified asthma, uncomplicated: Secondary | ICD-10-CM | POA: Insufficient documentation

## 2015-10-02 DIAGNOSIS — G8929 Other chronic pain: Secondary | ICD-10-CM | POA: Insufficient documentation

## 2015-10-02 DIAGNOSIS — X58XXXA Exposure to other specified factors, initial encounter: Secondary | ICD-10-CM | POA: Insufficient documentation

## 2015-10-02 DIAGNOSIS — Y998 Other external cause status: Secondary | ICD-10-CM | POA: Insufficient documentation

## 2015-10-02 DIAGNOSIS — Z862 Personal history of diseases of the blood and blood-forming organs and certain disorders involving the immune mechanism: Secondary | ICD-10-CM | POA: Insufficient documentation

## 2015-10-02 DIAGNOSIS — S0501XA Injury of conjunctiva and corneal abrasion without foreign body, right eye, initial encounter: Secondary | ICD-10-CM | POA: Insufficient documentation

## 2015-10-02 DIAGNOSIS — Y9389 Activity, other specified: Secondary | ICD-10-CM | POA: Insufficient documentation

## 2015-10-02 DIAGNOSIS — Y9289 Other specified places as the place of occurrence of the external cause: Secondary | ICD-10-CM | POA: Insufficient documentation

## 2015-10-02 DIAGNOSIS — Z23 Encounter for immunization: Secondary | ICD-10-CM | POA: Insufficient documentation

## 2015-10-02 DIAGNOSIS — Z87440 Personal history of urinary (tract) infections: Secondary | ICD-10-CM | POA: Insufficient documentation

## 2015-10-02 DIAGNOSIS — Z8719 Personal history of other diseases of the digestive system: Secondary | ICD-10-CM | POA: Insufficient documentation

## 2015-10-02 MED ORDER — OXYCODONE-ACETAMINOPHEN 5-325 MG PO TABS: 1.0000 | ORAL_TABLET | ORAL | Status: DC | PRN

## 2015-10-02 MED ORDER — TETANUS-DIPHTH-ACELL PERTUSSIS 5-2.5-18.5 LF-MCG/0.5 IM SUSP
0.5000 mL | Freq: Once | INTRAMUSCULAR | Status: AC
  Administered 2015-10-02: 0.5 mL via INTRAMUSCULAR
  Filled 2015-10-02: qty 0.5

## 2015-10-02 MED ORDER — OXYCODONE-ACETAMINOPHEN 5-325 MG PO TABS
1.0000 | ORAL_TABLET | Freq: Once | ORAL | Status: AC
  Administered 2015-10-02: 1 via ORAL
  Filled 2015-10-02: qty 1

## 2015-10-02 MED ORDER — CIPROFLOXACIN HCL 0.3 % OP SOLN
2.0000 [drp] | Freq: Four times a day (QID) | OPHTHALMIC | Status: DC
  Administered 2015-10-02: 2 [drp] via OPHTHALMIC
  Filled 2015-10-02: qty 2.5

## 2015-10-02 MED ORDER — TETRACAINE HCL 0.5 % OP SOLN
2.0000 [drp] | Freq: Once | OPHTHALMIC | Status: AC
  Administered 2015-10-02: 2 [drp] via OPHTHALMIC
  Filled 2015-10-02: qty 2

## 2015-10-02 MED ORDER — FLUORESCEIN SODIUM 1 MG OP STRP
1.0000 | ORAL_STRIP | Freq: Once | OPHTHALMIC | Status: AC
  Administered 2015-10-02: 1 via OPHTHALMIC
  Filled 2015-10-02: qty 1

## 2015-10-02 NOTE — ED Notes (Signed)
Declined W/C at D/C and was escorted to lobby by RN. 

## 2015-10-02 NOTE — ED Notes (Signed)
PT reports waking up this AM with pain in her RT eye. PT reports cause unknown . PT does not wear contacts.

## 2015-10-02 NOTE — Discharge Instructions (Signed)
Read the information below.  Use the prescribed medication as directed.  Please discuss all new medications with your pharmacist.  Do not take additional tylenol while taking the prescribed pain medication to avoid overdose.  You may return to the Emergency Department at any time for worsening condition or any new symptoms that concern you.    If you develop worsening pain in your eye, change in your vision, swelling around your eye, difficulty moving your eye, or fevers greater than 100.4, see your eye doctor or return to the Emergency Department immediately for a recheck.     Corneal Abrasion The cornea is the clear covering at the front and center of the eye. When looking at the colored portion of the eye (iris), you are looking through the cornea. This very thin tissue is made up of many layers. The surface layer is a single layer of cells (corneal epithelium) and is one of the most sensitive tissues in the body. If a scratch or injury causes the corneal epithelium to come off, it is called a corneal abrasion. If the injury extends to the tissues below the epithelium, the condition is called a corneal ulcer. CAUSES   Scratches.  Trauma.  Foreign body in the eye. Some people have recurrences of abrasions in the area of the original injury even after it has healed (recurrent erosion syndrome). Recurrent erosion syndrome generally improves and goes away with time. SYMPTOMS   Eye pain.  Difficulty or inability to keep the injured eye open.  The eye becomes very sensitive to light.  Recurrent erosions tend to happen suddenly, first thing in the morning, usually after waking up and opening the eye. DIAGNOSIS  Your health care provider can diagnose a corneal abrasion during an eye exam. Dye is usually placed in the eye using a drop or a small paper strip moistened by your tears. When the eye is examined with a special light, the abrasion shows up clearly because of the dye. TREATMENT   Small  abrasions may be treated with antibiotic drops or ointment alone.  A pressure patch may be put over the eye. If this is done, follow your doctor's instructions for when to remove the patch. Do not drive or use machines while the eye patch is on. Judging distances is hard to do with a patch on. If the abrasion becomes infected and spreads to the deeper tissues of the cornea, a corneal ulcer can result. This is serious because it can cause corneal scarring. Corneal scars interfere with light passing through the cornea and cause a loss of vision in the involved eye. HOME CARE INSTRUCTIONS  Use medicine or ointment as directed. Only take over-the-counter or prescription medicines for pain, discomfort, or fever as directed by your health care provider.  Do not drive or operate machinery if your eye is patched. Your ability to judge distances is impaired.  If your health care provider has given you a follow-up appointment, it is very important to keep that appointment. Not keeping the appointment could result in a severe eye infection or permanent loss of vision. If there is any problem keeping the appointment, let your health care provider know. SEEK MEDICAL CARE IF:   You have pain, light sensitivity, and a scratchy feeling in one eye or both eyes.  Your pressure patch keeps loosening up, and you can blink your eye under the patch after treatment.  Any kind of discharge develops from the eye after treatment or if the lids stick together  in the morning.  You have the same symptoms in the morning as you did with the original abrasion days, weeks, or months after the abrasion healed.   This information is not intended to replace advice given to you by your health care provider. Make sure you discuss any questions you have with your health care provider.   Document Released: 11/27/2000 Document Revised: 08/21/2015 Document Reviewed: 08/07/2013 Elsevier Interactive Patient Education [employer].

## 2015-10-02 NOTE — ED Provider Notes (Signed)
CSN: [id number]     Arrival date & time 10/02/15  0745 History   First MD Initiated Contact with Patient 10/02/15 0747     Chief Complaint  Patient presents with  . Eye Pain     (Consider location/radiation/quality/duration/timing/severity/associated sxs/prior Treatment) The history is provided by the patient.     Pt presents with 10/10 right eye pain that began this morning when she work up.  Described as feeling like there is something in her eye.  States she was grilling yesterday and got smoke in her eyes.  Otherwise denies any possibility of FB.  Denies trauma or chemical exposure to the eye.  Denies any change in personal care products.  Does not wear glasses or contacts.  Denies any change in vision or photophobia.  Has had sinusitis that is unchanged.  No other URI symptoms.   Past Medical History  Diagnosis Date  . Asthma   . Bronchitis   . Shortness of breath   . Bronchitis     hx of  . Urinary tract infection     hx of  . Seizures (HCC)     hx of, "not under treatment for at this time"  . Anemia   . Chronic constipation     hx of  . Chronic neck pain    Past Surgical History  Procedure Laterality Date  . Abdominal surgery    . Anterior cervical decomp/discectomy fusion  12/09/2012    Procedure: ANTERIOR CERVICAL DECOMPRESSION/DISCECTOMY FUSION 1 LEVEL;  Surgeon: Reinaldo Meeker, MD;  Location: [facility];  Service: Neurosurgery;  Laterality: N/A;  Anterior Cervical Decompression/Discectomy Fusion Cervical Five-Six    History reviewed. No pertinent family history. Social History  Substance Use Topics  . Smoking status: Current Some Day Smoker -- 8 years    Types: Cigarettes  . Smokeless tobacco: None     Comment: "off and on"  . Alcohol Use: Yes     Comment: "social"   OB History    No data available     Review of Systems  Constitutional: Negative for fever.  HENT: Positive for dental problem (known cavity) and sinus pressure. Negative for drooling,  ear pain, facial swelling and sore throat.   Eyes: Positive for pain, discharge and redness. Negative for photophobia, itching and visual disturbance.  Respiratory: Negative for cough and shortness of breath.   Allergic/Immunologic: Negative for immunocompromised state.  Hematological: Does not bruise/bleed easily.  Psychiatric/Behavioral: Negative for self-injury.      Allergies  Review of patient's allergies indicates no known allergies.  Home Medications   Prior to Admission medications   Medication Sig Start Date End Date Taking? Authorizing Provider  diphenhydramine-acetaminophen (TYLENOL PM) 25-500 MG TABS Take 0.5 tablets by mouth once.    Historical Provider, MD  HYDROcodone-acetaminophen (NORCO/VICODIN) 5-325 MG per tablet Take 2 tablets by mouth every 4 (four) hours as needed. 04/24/15   Elson Areas, PA-C  ibuprofen (ADVIL,MOTRIN) 800 MG tablet Take 1 tablet (800 mg total) by mouth 3 (three) times daily. 04/24/15   Elson Areas, PA-C  methocarbamol (ROBAXIN) 500 MG tablet Take 1 tablet (500 mg total) by mouth 2 (two) times daily. 04/24/15   Elson Areas, PA-C  oxyCODONE-acetaminophen (PERCOCET) 5-325 MG per tablet Take 2 tablets by mouth every 4 (four) hours as needed for pain. Patient not taking: Reported on 04/24/2015 03/06/13   Wayland Salinas, MD  sodium chloride (OCEAN) 0.65 % SOLN nasal spray Place 2 sprays into both nostrils  daily as needed for congestion.    Historical Provider, MD  Tetrahydrozoline HCl (VISINE OP) Apply 1-2 drops to eye daily as needed (dry eyes.).    Historical Provider, MD   BP 151/96 mmHg  Pulse 84  Temp(Src) 98.1 F (36.7 C) (Oral)  Resp 18  Ht 5\' 3"  (1.6 m)  Wt 170 lb (77.111 kg)  BMI 30.12 kg/m2  SpO2 97%  LMP 09/25/2015 Physical Exam  Constitutional: She appears well-developed and well-nourished. No distress.  HENT:  Head: Normocephalic and atraumatic.  Eyes: EOM are normal. Pupils are equal, round, and reactive to light. Lids are  everted and swept, no foreign bodies found. Right eye exhibits no chemosis, no exudate and no hordeolum. No foreign body present in the right eye. Right conjunctiva is injected. Right conjunctiva has no hemorrhage. No scleral icterus.  Slit lamp exam:      The right eye shows corneal abrasion. The right eye shows no corneal flare, no corneal ulcer, no foreign body, no hyphema and no hypopyon.  Linear corneal abrasion over inferior iris of right eye.  No FB.  Pt crying, clear discharge from eyes.    Neck: Neck supple.  Pulmonary/Chest: Effort normal.  Neurological: She is alert.  Skin: She is not diaphoretic.  Nursing note and vitals reviewed.   ED Course  Procedures (including critical care time) Labs Review Labs Reviewed - No data to display  Imaging Review No results found. I have personally reviewed and evaluated these images and lab results as part of my medical decision-making.   EKG Interpretation None      MDM   Final diagnoses:  Corneal abrasion, right, initial encounter    Afebrile, nontoxic patient with corneal abrasion that occurred overnight.  She does not wear contacts.  No insurance.  Meds started in ED.   D/C home with cipro drops, percocet, ophthalmology follow up.  Tdap updated.  Discussed result, findings, treatment, and follow up  with patient.  Pt given return precautions.  Pt verbalizes understanding and agrees with plan.         Trixie Dredgemily Creedence Heiss, PA-C 10/02/15 1053  Gilda Creasehristopher J Pollina, MD 10/02/15 1229

## 2015-12-03 ENCOUNTER — Emergency Department (HOSPITAL_COMMUNITY)
Admission: EM | Admit: 2015-12-03 | Discharge: 2015-12-03 | Disposition: A | Discharge status: Home / Self Care | Payer: PRIVATE HEALTH INSURANCE | Attending: Emergency Medicine | Admitting: Emergency Medicine

## 2015-12-03 ENCOUNTER — Encounter (HOSPITAL_COMMUNITY): Payer: Self-pay | Admitting: Emergency Medicine

## 2015-12-03 DIAGNOSIS — N898 Other specified noninflammatory disorders of vagina: Secondary | ICD-10-CM

## 2015-12-03 DIAGNOSIS — J45909 Unspecified asthma, uncomplicated: Secondary | ICD-10-CM | POA: Insufficient documentation

## 2015-12-03 DIAGNOSIS — N760 Acute vaginitis: Secondary | ICD-10-CM

## 2015-12-03 DIAGNOSIS — Z87440 Personal history of urinary (tract) infections: Secondary | ICD-10-CM | POA: Insufficient documentation

## 2015-12-03 DIAGNOSIS — G8929 Other chronic pain: Secondary | ICD-10-CM | POA: Insufficient documentation

## 2015-12-03 DIAGNOSIS — B9689 Other specified bacterial agents as the cause of diseases classified elsewhere: Secondary | ICD-10-CM

## 2015-12-03 DIAGNOSIS — I10 Essential (primary) hypertension: Secondary | ICD-10-CM | POA: Insufficient documentation

## 2015-12-03 DIAGNOSIS — F17210 Nicotine dependence, cigarettes, uncomplicated: Secondary | ICD-10-CM | POA: Insufficient documentation

## 2015-12-03 DIAGNOSIS — Z79899 Other long term (current) drug therapy: Secondary | ICD-10-CM | POA: Insufficient documentation

## 2015-12-03 DIAGNOSIS — Z862 Personal history of diseases of the blood and blood-forming organs and certain disorders involving the immune mechanism: Secondary | ICD-10-CM | POA: Insufficient documentation

## 2015-12-03 HISTORY — DX: Essential (primary) hypertension: I10

## 2015-12-03 LAB — GC/CHLAMYDIA PROBE AMP (~~LOC~~) NOT AT ARMC
Chlamydia: NEGATIVE
NEISSERIA GONORRHEA: NEGATIVE

## 2015-12-03 LAB — WET PREP, GENITAL
Sperm: NONE SEEN
TRICH WET PREP: NONE SEEN
YEAST WET PREP: NONE SEEN

## 2015-12-03 MED ORDER — NYSTATIN 100000 UNIT/GM EX CREA: TOPICAL_CREAM | CUTANEOUS | Status: DC

## 2015-12-03 MED ORDER — METRONIDAZOLE 500 MG PO TABS: 500.0000 mg | ORAL_TABLET | Freq: Two times a day (BID) | ORAL | Status: DC

## 2015-12-03 NOTE — ED Notes (Signed)
Patient states she started noticing some bumps in her Vaginal area about three weeks ago. Patient states its tender to touch and hurts with urination. Patient states she has not been sexual active in 3 weeks. Patient also states she notice some white discharge but states she always has that discharge before and after her cycle.

## 2015-12-03 NOTE — ED Notes (Signed)
PA aware of discharge BP.

## 2015-12-03 NOTE — ED Provider Notes (Signed)
CSN: [id number]     Arrival date & time 12/03/15  4098 History   First MD Initiated Contact with Patient 12/03/15 [phone]     Chief Complaint  Patient presents with  . Bumps in Vaginal area      (Consider location/radiation/quality/duration/timing/severity/associated sxs/prior Treatment) HPI Comments: Patient presents with complaint of external vaginal irritation ongoing for several weeks. She states that this area burns. She has had some white discharge which is not unusual around her menstrual period. LMP was one week ago. This was normal for her. No fever, vomiting, diarrhea. No dysuria. No treatments prior to arrival. Patient has a past history of boils in the groin. Onset of symptoms acute. Course is constant. Nothing makes symptoms better or worse.  The history is provided by the patient.    Past Medical History  Diagnosis Date  . Asthma   . Bronchitis   . Shortness of breath   . Bronchitis     hx of  . Urinary tract infection     hx of  . Seizures (HCC)     hx of, "not under treatment for at this time"  . Anemia   . Chronic constipation     hx of  . Chronic neck pain   . Hypertension    Past Surgical History  Procedure Laterality Date  . Abdominal surgery    . Anterior cervical decomp/discectomy fusion  12/09/2012    Procedure: ANTERIOR CERVICAL DECOMPRESSION/DISCECTOMY FUSION 1 LEVEL;  Surgeon: Reinaldo Meeker, MD;  Location: [facility];  Service: Neurosurgery;  Laterality: N/A;  Anterior Cervical Decompression/Discectomy Fusion Cervical Five-Six    No family history on file. Social History  Substance Use Topics  . Smoking status: Current Some Day Smoker -- 8 years    Types: Cigarettes  . Smokeless tobacco: None     Comment: "off and on"  . Alcohol Use: Yes     Comment: "social"   OB History    No data available     Review of Systems  Constitutional: Negative for fever.  Gastrointestinal: Negative for nausea, vomiting, abdominal pain and diarrhea.    Genitourinary: Positive for vaginal discharge. Negative for dysuria, frequency, hematuria, vaginal bleeding and pelvic pain.  Musculoskeletal: Negative for myalgias.  Skin: Positive for color change and rash.      Allergies  Review of patient's allergies indicates no known allergies.  Home Medications   Prior to Admission medications   Medication Sig Start Date End Date Taking? Authorizing Provider  diphenhydramine-acetaminophen (TYLENOL PM) 25-500 MG TABS Take 0.5 tablets by mouth once.    Historical Provider, MD  HYDROcodone-acetaminophen (NORCO/VICODIN) 5-325 MG per tablet Take 2 tablets by mouth every 4 (four) hours as needed. 04/24/15   Elson Areas, PA-C  ibuprofen (ADVIL,MOTRIN) 800 MG tablet Take 1 tablet (800 mg total) by mouth 3 (three) times daily. 04/24/15   Elson Areas, PA-C  methocarbamol (ROBAXIN) 500 MG tablet Take 1 tablet (500 mg total) by mouth 2 (two) times daily. 04/24/15   Elson Areas, PA-C  oxyCODONE-acetaminophen (PERCOCET/ROXICET) 5-325 MG tablet Take 1-2 tablets by mouth every 4 (four) hours as needed for moderate pain or severe pain. 10/02/15   Trixie Dredge, PA-C  sodium chloride (OCEAN) 0.65 % SOLN nasal spray Place 2 sprays into both nostrils daily as needed for congestion.    Historical Provider, MD  Tetrahydrozoline HCl (VISINE OP) Apply 1-2 drops to eye daily as needed (dry eyes.).    Historical Provider, MD  BP 177/102 mmHg  Pulse 77  Temp(Src) 98.1 F (36.7 C) (Oral)  Resp 15  SpO2 100%  LMP 11/26/2015   Physical Exam  Constitutional: She appears well-developed and well-nourished.  HENT:  Head: Normocephalic and atraumatic.  Eyes: Conjunctivae are normal.  Neck: Normal range of motion. Neck supple.  Pulmonary/Chest: No respiratory distress.  Genitourinary:    There is rash on the right labia. There is no lesion or injury on the right labia. There is rash on the left labia. There is no lesion or injury on the left labia. Uterus is not  tender. Cervix exhibits no motion tenderness, no discharge and no friability. Right adnexum displays no mass, no tenderness and no fullness. Left adnexum displays no mass, no tenderness and no fullness. No bleeding in the vagina. Vaginal discharge (white) found.  Neurological: She is alert.  Skin: Skin is warm and dry.  Psychiatric: She has a normal mood and affect.  Nursing note and vitals reviewed.   ED Course  Procedures (including critical care time) Labs Review Labs Reviewed  WET PREP, GENITAL  GC/CHLAMYDIA PROBE AMP ([place]) NOT [facility]    Imaging Review No results found. I have personally reviewed and evaluated these images and lab results as part of my medical decision-making.   EKG Interpretation None       8:49 AM Patient seen and examined.   Vital signs reviewed and are as follows: BP 177/102 mmHg  Pulse 77  Temp(Src) 98.1 F (36.7 C) (Oral)  Resp 15  SpO2 100%  LMP 11/26/2015  Pelvic exam performed with NT chaperone Ladona Ridgel([place]). Patient elects to receive STD treatment here.   9:20 AM empirically treat for candidiasis with nystatin cream. If this does not improve her symptoms in 3-5 days, patient is instructed to switch to over-the-counter hydrocortisone cream. Will test and treat for STD exposure.  Patient counseled on safe sexual practices. Told them that they should not have sexual contact for next 7 days and that they need to inform sexual partners so that they can get tested and treated as well. Patient verbalizes understanding and agrees with plan.     MDM   Final diagnoses:  Vaginal irritation  Bacterial vaginosis   Patient with vaginal irritation, candidiasis versus dermatitis. Treatment as above. Given vaginal discharge, possible sexually transmitted disease, patient tested and treated for gonorrhea and chlamydia.   Renne CriglerJoshua Skylee Baird, PA-C 12/03/15 [phone]  Eber HongBrian Miller, MD 12/04/15 2133

## 2015-12-03 NOTE — Discharge Instructions (Signed)
Please read and follow all provided instructions.  Your diagnoses today include:  1. Vaginal irritation   2. Bacterial vaginosis     Tests performed today include:  Test for gonorrhea and chlamydia.   Wet prep - suggests bacterial vaginosis  Vital signs. See below for your results today.   Medications:  You were treated with azithromycin and rocephin today. These antibiotics treat you for gonorrhea and chlamydia. They do not treat for HIV or syphilis.   Nystatin - use cream for 5 days  If symptoms do not improve, switch to 1% topical hydrocortisone which can be found over-the-counter.  Home care instructions:  Read educational materials contained in this packet and follow any instructions provided.   You should tell your partners about your infection and avoid having sex for one week to allow time for the medicine to work.  Sexually transmitted disease testing also available at:   [facility] [place] [place]  [address], [place], phone [phone] or [phone]    Monday - Friday, call for an appointment  [facility] [place]  [address], [place], phone [phone] or [phone]   Monday - Friday, call for an appointment  Return instructions:   Please return to the Emergency Department if you experience worsening symptoms.   Please return if you have any other emergent concerns.  Additional Information:  Your vital signs today were: BP 177/102 mmHg   Pulse 77   Temp(Src) 98.1 F (36.7 C) (Oral)   Resp 15   SpO2 100%   LMP 11/26/2015 If your blood pressure (BP) was elevated above 135/85 this visit, please have this repeated by your doctor within one month. --------------

## 2016-09-26 ENCOUNTER — Emergency Department (HOSPITAL_COMMUNITY): Payer: Self-pay

## 2016-09-26 ENCOUNTER — Emergency Department (HOSPITAL_COMMUNITY)
Admission: EM | Admit: 2016-09-26 | Discharge: 2016-09-28 | Disposition: A | Discharge status: Home / Self Care | Payer: Self-pay | Attending: Emergency Medicine | Admitting: Emergency Medicine

## 2016-09-26 ENCOUNTER — Encounter (HOSPITAL_COMMUNITY): Payer: Self-pay

## 2016-09-26 DIAGNOSIS — F17210 Nicotine dependence, cigarettes, uncomplicated: Secondary | ICD-10-CM | POA: Insufficient documentation

## 2016-09-26 DIAGNOSIS — R930 Abnormal findings on diagnostic imaging of skull and head, not elsewhere classified: Secondary | ICD-10-CM | POA: Insufficient documentation

## 2016-09-26 DIAGNOSIS — W19XXXA Unspecified fall, initial encounter: Secondary | ICD-10-CM | POA: Insufficient documentation

## 2016-09-26 DIAGNOSIS — R569 Unspecified convulsions: Secondary | ICD-10-CM

## 2016-09-26 DIAGNOSIS — N3090 Cystitis, unspecified without hematuria: Secondary | ICD-10-CM | POA: Insufficient documentation

## 2016-09-26 DIAGNOSIS — Y999 Unspecified external cause status: Secondary | ICD-10-CM | POA: Insufficient documentation

## 2016-09-26 DIAGNOSIS — S1093XA Contusion of unspecified part of neck, initial encounter: Secondary | ICD-10-CM | POA: Insufficient documentation

## 2016-09-26 DIAGNOSIS — J45909 Unspecified asthma, uncomplicated: Secondary | ICD-10-CM | POA: Insufficient documentation

## 2016-09-26 DIAGNOSIS — M25552 Pain in left hip: Secondary | ICD-10-CM | POA: Insufficient documentation

## 2016-09-26 DIAGNOSIS — I10 Essential (primary) hypertension: Secondary | ICD-10-CM | POA: Insufficient documentation

## 2016-09-26 DIAGNOSIS — Y929 Unspecified place or not applicable: Secondary | ICD-10-CM | POA: Insufficient documentation

## 2016-09-26 DIAGNOSIS — Y939 Activity, unspecified: Secondary | ICD-10-CM | POA: Insufficient documentation

## 2016-09-26 LAB — CBC WITH DIFFERENTIAL/PLATELET
BASOS ABS: 0 10*3/uL (ref 0.0–0.1)
Basophils Relative: 0 %
Eosinophils Absolute: 0 10*3/uL (ref 0.0–0.7)
Eosinophils Relative: 0 %
HEMATOCRIT: 33.9 % — AB (ref 36.0–46.0)
Hemoglobin: 11.2 g/dL — ABNORMAL LOW (ref 12.0–15.0)
LYMPHS PCT: 20 %
Lymphs Abs: 2.2 10*3/uL (ref 0.7–4.0)
MCH: 28.7 pg (ref 26.0–34.0)
MCHC: 33 g/dL (ref 30.0–36.0)
MCV: 86.9 fL (ref 78.0–100.0)
MONO ABS: 0.5 10*3/uL (ref 0.1–1.0)
Monocytes Relative: 5 %
NEUTROS ABS: 8.3 10*3/uL — AB (ref 1.7–7.7)
Neutrophils Relative %: 75 %
Platelets: 330 10*3/uL (ref 150–400)
RBC: 3.9 MIL/uL (ref 3.87–5.11)
RDW: 17 % — ABNORMAL HIGH (ref 11.5–15.5)
WBC: 11.1 10*3/uL — ABNORMAL HIGH (ref 4.0–10.5)

## 2016-09-26 LAB — URINALYSIS, ROUTINE W REFLEX MICROSCOPIC
BILIRUBIN URINE: NEGATIVE
Glucose, UA: NEGATIVE mg/dL
Ketones, ur: NEGATIVE mg/dL
LEUKOCYTES UA: NEGATIVE
NITRITE: POSITIVE — AB
Protein, ur: NEGATIVE mg/dL
SPECIFIC GRAVITY, URINE: 1.024 (ref 1.005–1.030)
pH: 6.5 (ref 5.0–8.0)

## 2016-09-26 LAB — RAPID URINE DRUG SCREEN, HOSP PERFORMED
AMPHETAMINES: NOT DETECTED
Barbiturates: NOT DETECTED
Benzodiazepines: POSITIVE — AB
Cocaine: NOT DETECTED
Opiates: NOT DETECTED
Tetrahydrocannabinol: POSITIVE — AB

## 2016-09-26 LAB — URINE MICROSCOPIC-ADD ON

## 2016-09-26 LAB — COMPREHENSIVE METABOLIC PANEL
ALK PHOS: 67 U/L (ref 38–126)
ALT: 17 U/L (ref 14–54)
AST: 23 U/L (ref 15–41)
Albumin: 4 g/dL (ref 3.5–5.0)
Anion gap: 12 (ref 5–15)
BILIRUBIN TOTAL: 0.5 mg/dL (ref 0.3–1.2)
BUN: 5 mg/dL — AB (ref 6–20)
CO2: 19 mmol/L — ABNORMAL LOW (ref 22–32)
CREATININE: 0.7 mg/dL (ref 0.44–1.00)
Calcium: 9.5 mg/dL (ref 8.9–10.3)
Chloride: 106 mmol/L (ref 101–111)
GFR calc Af Amer: >60 mL/min (ref >60)
Glucose, Bld: 121 mg/dL — ABNORMAL HIGH (ref 65–99)
Potassium: 3.6 mmol/L (ref 3.5–5.1)
Sodium: 137 mmol/L (ref 135–145)
TOTAL PROTEIN: 7.7 g/dL (ref 6.5–8.1)

## 2016-09-26 LAB — POC URINE PREG, ED: PREG TEST UR: NEGATIVE

## 2016-09-26 MED ORDER — LORAZEPAM 1 MG PO TABS
1.0000 mg | ORAL_TABLET | Freq: Once | ORAL | Status: AC
  Administered 2016-09-26: 1 mg via ORAL
  Filled 2016-09-26: qty 1

## 2016-09-26 MED ORDER — IBUPROFEN 800 MG PO TABS: 800.0000 mg | ORAL_TABLET | Freq: Three times a day (TID) | ORAL | 0 refills | Status: DC

## 2016-09-26 MED ORDER — HYDROXYZINE HCL 25 MG PO TABS: 25.0000 mg | ORAL_TABLET | Freq: Three times a day (TID) | ORAL | 0 refills | Status: DC | PRN

## 2016-09-26 MED ORDER — CEPHALEXIN 500 MG PO CAPS: 500.0000 mg | ORAL_CAPSULE | Freq: Four times a day (QID) | ORAL | 0 refills | Status: DC

## 2016-09-26 MED ORDER — IBUPROFEN 800 MG PO TABS
800.0000 mg | ORAL_TABLET | Freq: Once | ORAL | Status: AC
  Administered 2016-09-26: 800 mg via ORAL
  Filled 2016-09-26: qty 1

## 2016-09-26 NOTE — ED Triage Notes (Signed)
Patient complains of left pain and abrasion to left neck after possible seizure last pm. Patient states she has seizures when she has stress. Parent died 3 weeks ago and patient being kicked out of her home. On arrival alert and oriented, NAD

## 2016-09-26 NOTE — ED Notes (Signed)
Pt unable to provide UA sample on bed pan. Pt transported to xray

## 2016-09-26 NOTE — ED Provider Notes (Signed)
[facility] Provider Note   CSN: [id number] Arrival date & time: 09/26/16  [id number]     History   Chief Complaint Chief Complaint  Patient presents with  . possible seizure last pm    HPI [name] is a 45 y.o. female.  The history is provided by the patient and a relative. No language interpreter was used.   [name] is a 45 y.o. female who presents to the Emergency Department complaining of possible seizure.  She is concerned that she had a seizure last night. 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 Her mother passed away recently. Last night she had one or 2 beers and does not remember what happened the rest the night. Family states that she had shaking all over for an unknown period of time at 11:30 last night. The first thing she remembers is waking up this morning calling out. She reports pain to the left side of her neck, mild headache, pain in her left hip and thigh. She states she had fecal incontinence. Past Medical History:  Diagnosis Date  . Anemia   . Asthma   . Bronchitis   . Bronchitis    hx of  . Chronic constipation    hx of  . Chronic neck pain   . Hypertension   . Seizures (HCC)    hx of, "not under treatment for at this time"  . Shortness of breath   . Urinary tract infection    hx of    There are no active problems to display for this patient.   Past Surgical History:  Procedure Laterality Date  . ABDOMINAL SURGERY    . ANTERIOR CERVICAL DECOMP/DISCECTOMY FUSION  12/09/2012   Procedure: ANTERIOR CERVICAL DECOMPRESSION/DISCECTOMY FUSION 1 LEVEL;  Surgeon: Reinaldo Meekerandy O Kritzer, MD;  Location: [facility];  Service: Neurosurgery;  Laterality: N/A;  Anterior Cervical Decompression/Discectomy Fusion Cervical Five-Six     OB History    No data available       Home Medications    Prior to Admission medications   Medication Sig Start Date End Date Taking? Authorizing Provider  Tetrahydrozoline  HCl (VISINE OP) Apply 1-2 drops to eye daily as needed (dry eyes.).   Yes Historical Provider, MD  cephALEXin (KEFLEX) 500 MG capsule Take 1 capsule (500 mg total) by mouth 4 (four) times daily. 09/26/16   Tilden FossaElizabeth Elray Dains, MD  hydrOXYzine (ATARAX/VISTARIL) 25 MG tablet Take 1 tablet (25 mg total) by mouth every 8 (eight) hours as needed for anxiety. 09/26/16   Tilden FossaElizabeth Keryl Gholson, MD  ibuprofen (ADVIL,MOTRIN) 800 MG tablet Take 1 tablet (800 mg total) by mouth 3 (three) times daily. 09/26/16   Tilden FossaElizabeth Awesome Jared, MD    Family History No family history on file.  Social History Social History  Substance Use Topics  . Smoking status: Current Some Day Smoker    Years: 8.00    Types: Cigarettes  . Smokeless tobacco: Never Used     Comment: "off and on"  . Alcohol use Yes     Comment: "social"     Allergies   Review of patient's allergies indicates no known allergies.   Review of Systems Review of Systems  All other systems reviewed and are negative.    Physical Exam Updated Vital Signs BP 156/84   Pulse 85   Temp 98.8 F (37.1 C) (Oral)   Resp 26   Ht 5\' 3"  (1.6 m)   Wt 173 lb (  78.5 kg)   SpO2 93%   BMI 30.65 kg/m   Physical Exam  Constitutional: She is oriented to person, place, and time. She appears well-developed and well-nourished.  HENT:  Head: Normocephalic and atraumatic.  Bruising to the anterior tongue.  Eyes: EOM are normal. Pupils are equal, round, and reactive to light.  Neck:  Abrasion to the left neck with mild local tenderness. No C-spine tenderness to palpation  Cardiovascular: Normal rate and regular rhythm.   No murmur heard. Pulmonary/Chest: Effort normal and breath sounds normal. No respiratory distress.  Abdominal: Soft. There is no tenderness. There is no rebound and no guarding.  Musculoskeletal: She exhibits no edema.  Tenderness over the left hip and left thigh.  Neurological: She is alert and oriented to person, place, and time.  5 out of 5  strength in all 4 extremities. No facial asymmetry  Skin: Skin is warm and dry.  Psychiatric:  Anxious and tearful  Nursing note and vitals reviewed.    ED Treatments / Results  Labs (all labs ordered are listed, but only abnormal results are displayed) Labs Reviewed  CBC WITH DIFFERENTIAL/PLATELET - Abnormal; Notable for the following:       Result Value   WBC 11.1 (*)    Hemoglobin 11.2 (*)    HCT 33.9 (*)    RDW 17.0 (*)    Neutro Abs 8.3 (*)    All other components within normal limits  COMPREHENSIVE METABOLIC PANEL - Abnormal; Notable for the following:    CO2 19 (*)    Glucose, Bld 121 (*)    BUN 5 (*)    All other components within normal limits  RAPID URINE DRUG SCREEN, HOSP PERFORMED - Abnormal; Notable for the following:    Benzodiazepines POSITIVE (*)    Tetrahydrocannabinol POSITIVE (*)    All other components within normal limits  URINALYSIS, ROUTINE W REFLEX MICROSCOPIC (NOT AT [facility]) - Abnormal; Notable for the following:    APPearance HAZY (*)    Hgb urine dipstick TRACE (*)    Nitrite POSITIVE (*)    All other components within normal limits  URINE MICROSCOPIC-ADD ON - Abnormal; Notable for the following:    Squamous Epithelial / LPF 6-30 (*)    Bacteria, UA FEW (*)    All other components within normal limits  POC URINE PREG, ED    EKG  EKG Interpretation None       Radiology Dg Chest 2 View  Result Date: 09/26/2016 CLINICAL DATA:  Patient reports having a seizure and falling, patient does not remember if she hit anything, patient reports generalized left hip pain that radiates to her left knee. Patient had onset of severe lower right chest pain. EXAM: CHEST  2 VIEW COMPARISON:  Chest x-ray dated 12/01/2012. FINDINGS: The heart size and mediastinal contours are within normal limits. Both lungs are clear. No acute or suspicious osseous finding. IMPRESSION: No active cardiopulmonary disease. Electronically Signed   By: Bary Richard M.D.   On:  09/26/2016 13:01   Dg Pelvis 1-2 Views  Result Date: 09/26/2016 CLINICAL DATA:  Patient reports having a seizure and falling, patient does not remember if she hit anything, patient reports generalized left hip pain that radiates to her left knee. Patient had onset of severe lower right chest pain. EXAM: PELVIS - 1-2 VIEW COMPARISON:  CT abdomen/pelvis dated 04/24/2015. FINDINGS: There is no evidence of pelvic fracture or diastasis. No pelvic bone lesions are seen. Soft tissues about the pelvis are  unremarkable. Multiple small calcifications within the pelvis are compatible with benign vascular phleboliths, better demonstrated on an earlier CT pelvis. IMPRESSION: Negative. Electronically Signed   By: Bary Richard M.D.   On: 09/26/2016 13:04   Ct Head Wo Contrast  Result Date: 09/26/2016 CLINICAL DATA:  Status post fall, possible seizure EXAM: CT HEAD WITHOUT CONTRAST CT CERVICAL SPINE WITHOUT CONTRAST TECHNIQUE: Multidetector CT imaging of the head and cervical spine was performed following the standard protocol without intravenous contrast. Multiplanar CT image reconstructions of the cervical spine were also generated. COMPARISON:  None. FINDINGS: CT HEAD FINDINGS Brain: No evidence of acute infarction, hemorrhage, hydrocephalus, extra-axial collection or mass lesion/mass effect. Mineralization of the basal ganglia bilaterally. Vascular: No hyperdense vessel or unexpected calcification. Skull: No osseous abnormality. Sinuses/Orbits: New complete opacification of the right maxillary sinus. Bilateral ethmoid sinus mucosal thickening. Visualized mastoid sinuses are clear. Visualized orbits demonstrate no focal abnormality. Other: None CT CERVICAL SPINE FINDINGS Alignment: Normal. Skull base and vertebrae: No acute fracture. No primary bone lesion or focal pathologic process. Soft tissues and spinal canal: No prevertebral fluid or swelling. No visible canal hematoma. Disc levels: Anterior cervical fusion at  C5-6 without failure complication. Remainder the disc spaces are preserved. No significant foraminal or central canal stenosis. Upper chest: Lung apices are clear. Other: No fluid collection or hematoma. IMPRESSION: 1. No acute intracranial pathology. 2.  No acute osseous injury of the cervical spine. Electronically Signed   By: Elige Ko   On: 09/26/2016 13:45   Ct Cervical Spine Wo Contrast  Result Date: 09/26/2016 CLINICAL DATA:  Status post fall, possible seizure EXAM: CT HEAD WITHOUT CONTRAST CT CERVICAL SPINE WITHOUT CONTRAST TECHNIQUE: Multidetector CT imaging of the head and cervical spine was performed following the standard protocol without intravenous contrast. Multiplanar CT image reconstructions of the cervical spine were also generated. COMPARISON:  None. FINDINGS: CT HEAD FINDINGS Brain: No evidence of acute infarction, hemorrhage, hydrocephalus, extra-axial collection or mass lesion/mass effect. Mineralization of the basal ganglia bilaterally. Vascular: No hyperdense vessel or unexpected calcification. Skull: No osseous abnormality. Sinuses/Orbits: New complete opacification of the right maxillary sinus. Bilateral ethmoid sinus mucosal thickening. Visualized mastoid sinuses are clear. Visualized orbits demonstrate no focal abnormality. Other: None CT CERVICAL SPINE FINDINGS Alignment: Normal. Skull base and vertebrae: No acute fracture. No primary bone lesion or focal pathologic process. Soft tissues and spinal canal: No prevertebral fluid or swelling. No visible canal hematoma. Disc levels: Anterior cervical fusion at C5-6 without failure complication. Remainder the disc spaces are preserved. No significant foraminal or central canal stenosis. Upper chest: Lung apices are clear. Other: No fluid collection or hematoma. IMPRESSION: 1. No acute intracranial pathology. 2.  No acute osseous injury of the cervical spine. Electronically Signed   By: Elige Ko   On: 09/26/2016 13:45   Dg Femur  Min 2 Views Left  Result Date: 09/26/2016 CLINICAL DATA:  Patient reports having a seizure and falling, patient does not remember if she hit anything, patient reports generalized left hip pain that radiates to her left knee. Patient had onset of severe lower right chest pain. EXAM: LEFT FEMUR 2 VIEWS COMPARISON:  None. FINDINGS: There is no evidence of fracture or other focal bone lesions. Soft tissues are unremarkable. IMPRESSION: Negative. Electronically Signed   By: Bary Richard M.D.   On: 09/26/2016 13:02    Procedures Procedures (including critical care time)  Medications Ordered in ED Medications  ibuprofen (ADVIL,MOTRIN) tablet 800 mg (800 mg Oral Given 09/26/16  1203)  LORazepam (ATIVAN) tablet 1 mg (1 mg Oral Given 09/26/16 1336)     Initial Impression / Assessment and Plan / ED Course  I have reviewed the triage vital signs and the nursing notes.  Pertinent labs & imaging results that were available during my care of the patient were reviewed by me and considered in my medical decision making (see chart for details).  Clinical Course    Patient here for evaluation of seizure-like event with history of seizures in the past it is unclear if she has a history of seizure disorder or pseudoseizures. She has a nonfocal neurologic examination. For developing a UTI, we will treat for cystitis. Discussed seizure precautions and importance of neurology follow-up for further evaluation and testing as well as return precautions.  Final Clinical Impressions(s) / ED Diagnoses   Final diagnoses:  Seizure-like activity (HCC)  Contusion of neck, initial encounter  Cystitis    New Prescriptions New Prescriptions   CEPHALEXIN (KEFLEX) 500 MG CAPSULE    Take 1 capsule (500 mg total) by mouth 4 (four) times daily.   HYDROXYZINE (ATARAX/VISTARIL) 25 MG TABLET    Take 1 tablet (25 mg total) by mouth every 8 (eight) hours as needed for anxiety.   IBUPROFEN (ADVIL,MOTRIN) 800 MG TABLET    Take  1 tablet (800 mg total) by mouth 3 (three) times daily.     Tilden Fossa, MD 09/26/16 [phone]

## 2017-02-03 ENCOUNTER — Emergency Department (HOSPITAL_COMMUNITY)
Admission: EM | Admit: 2017-02-03 | Discharge: 2017-02-03 | Disposition: A | Discharge status: Home / Self Care | Payer: Self-pay | Attending: Emergency Medicine | Admitting: Emergency Medicine

## 2017-02-03 ENCOUNTER — Encounter (HOSPITAL_COMMUNITY): Payer: Self-pay | Admitting: Emergency Medicine

## 2017-02-03 DIAGNOSIS — F17210 Nicotine dependence, cigarettes, uncomplicated: Secondary | ICD-10-CM | POA: Insufficient documentation

## 2017-02-03 DIAGNOSIS — K0889 Other specified disorders of teeth and supporting structures: Secondary | ICD-10-CM

## 2017-02-03 DIAGNOSIS — J45909 Unspecified asthma, uncomplicated: Secondary | ICD-10-CM | POA: Insufficient documentation

## 2017-02-03 DIAGNOSIS — I10 Essential (primary) hypertension: Secondary | ICD-10-CM | POA: Insufficient documentation

## 2017-02-03 DIAGNOSIS — Z79899 Other long term (current) drug therapy: Secondary | ICD-10-CM | POA: Insufficient documentation

## 2017-02-03 MED ORDER — PENICILLIN V POTASSIUM 500 MG PO TABS: 500.0000 mg | ORAL_TABLET | Freq: Three times a day (TID) | ORAL | 0 refills | Status: DC

## 2017-02-03 MED ORDER — IBUPROFEN 800 MG PO TABS: 800.0000 mg | ORAL_TABLET | Freq: Three times a day (TID) | ORAL | 0 refills | Status: DC | PRN

## 2017-02-03 NOTE — ED Provider Notes (Signed)
[facility] Provider Note   CSN: [id number] Arrival date & time: 02/03/17  [id number]   By signing my name below, I, [name], attest that this documentation has been prepared under the direction and in the presence of Fayrene HelperBowie Xitlaly Ault, PA-C. Electronically Signed: Teofilo PodMatthew P. Carr, ED Scribe. 02/03/2017. 10:09 AM.   History   Chief Complaint Chief Complaint  Patient presents with  . Dental Pain   The history is provided by the patient. No language interpreter was used.   HPI Comments:  [name] is a 46 y.o. female who presents to the Emergency Department complaining of constant left-sided dental pain x 2-3 weeks. Pt states that she has a cavity on an upper left molar, and also reports pain and swelling and a broken tooth on the lower left side. She also notes associated ear pain. She states that the pain keeps her up at night, and states that the pain is made worse with drinking cold fluids. Pt has not seen a dentist. Pt is a smoker. Pt has rinsed with salt water and used Oragel with no relief. Pt denies chest pain, fever, SOB.   Past Medical History:  Diagnosis Date  . Anemia   . Asthma   . Bronchitis   . Bronchitis    hx of  . Chronic constipation    hx of  . Chronic neck pain   . Hypertension    situational  . Seizures (HCC)    hx of, "not under treatment for at this time"  . Shortness of breath   . Urinary tract infection    hx of    There are no active problems to display for this patient.   Past Surgical History:  Procedure Laterality Date  . ABDOMINAL SURGERY    . ANTERIOR CERVICAL DECOMP/DISCECTOMY FUSION  12/09/2012   Procedure: ANTERIOR CERVICAL DECOMPRESSION/DISCECTOMY FUSION 1 LEVEL;  Surgeon: Reinaldo Meekerandy O Kritzer, MD;  Location: [facility];  Service: Neurosurgery;  Laterality: N/A;  Anterior Cervical Decompression/Discectomy Fusion Cervical Five-Six     OB History    No data available       Home Medications    Prior to Admission  medications   Medication Sig Start Date End Date Taking? Authorizing Provider  cephALEXin (KEFLEX) 500 MG capsule Take 1 capsule (500 mg total) by mouth 4 (four) times daily. 09/26/16   Tilden FossaElizabeth Rees, MD  hydrOXYzine (ATARAX/VISTARIL) 25 MG tablet Take 1 tablet (25 mg total) by mouth every 8 (eight) hours as needed for anxiety. 09/26/16   Tilden FossaElizabeth Rees, MD  ibuprofen (ADVIL,MOTRIN) 800 MG tablet Take 1 tablet (800 mg total) by mouth 3 (three) times daily. 09/26/16   Tilden FossaElizabeth Rees, MD  Tetrahydrozoline HCl (VISINE OP) Apply 1-2 drops to eye daily as needed (dry eyes.).    Historical Provider, MD    Family History No family history on file.  Social History Social History  Substance Use Topics  . Smoking status: Current Some Day Smoker    Years: 8.00    Types: Cigarettes  . Smokeless tobacco: Never Used     Comment: "off and on"  . Alcohol use Yes     Comment: "social"     Allergies   Patient has no known allergies.   Review of Systems Review of Systems  Constitutional: Negative for fever.  HENT: Positive for dental problem, ear pain and facial swelling.   Respiratory: Negative for shortness of breath.   Cardiovascular: Negative for chest pain.  Physical Exam Updated Vital Signs BP (!) 147/122 (BP Location: Left Arm)   Pulse 92   Temp 98.5 F (36.9 C) (Oral)   Resp 22   SpO2 100%   Physical Exam  Constitutional: She appears well-developed and well-nourished. No distress.  HENT:  Head: Normocephalic and atraumatic.  Right Ear: External ear normal.  Left Ear: External ear normal.  Nose: Nose normal.  TTP along left upper gum line with erythema or edema. No obvious abscess. TTP to the left lower jaw around tooth #17. Amalgam dental filling to most teeth without obvious abscess or significant decay. No trismus.   Eyes: Conjunctivae are normal.  Cardiovascular: Normal rate.   Pulmonary/Chest: Effort normal.  Abdominal: She exhibits no distension.  Neurological:  She is alert.  Skin: Skin is warm and dry.  Psychiatric: She has a normal mood and affect.  Nursing note and vitals reviewed.    ED Treatments / Results  DIAGNOSTIC STUDIES:  Oxygen Saturation is 100% on RA, normal by my interpretation.    COORDINATION OF CARE:  10:09 AM Will prescribe antibiotics and refer to dentist. Discussed treatment plan with pt at bedside and pt agreed to plan.   Labs (all labs ordered are listed, but only abnormal results are displayed) Labs Reviewed - No data to display  EKG  EKG Interpretation None       Radiology No results found.  Procedures Procedures (including critical care time)  Medications Ordered in ED Medications - No data to display   Initial Impression / Assessment and Plan / ED Course  I have reviewed the triage vital signs and the nursing notes.  Pertinent labs & imaging results that were available during my care of the patient were reviewed by me and considered in my medical decision making (see chart for details).     BP (!) 147/122 (BP Location: Left Arm)   Pulse 92   Temp 98.5 F (36.9 C) (Oral)   Resp 22   SpO2 100%    Final Clinical Impressions(s) / ED Diagnoses   Final diagnoses:  Pain, dental    New Prescriptions New Prescriptions   PENICILLIN V POTASSIUM (VEETID) 500 MG TABLET    Take 1 tablet (500 mg total) by mouth 3 (three) times daily.   I personally performed the services described in this documentation, which was scribed in my presence. The recorded information has been reviewed and is accurate.   Patient with dentalgia.  No abscess requiring immediate incision and drainage.  Exam not concerning for Ludwig's angina or pharyngeal abscess.  Will treat with abx and NSAIDs. Pt instructed to follow-up with dentist.  Discussed return precautions. Pt safe for discharge.     Fayrene Helper, PA-C 02/03/17 1019    Marily Memos, MD 02/03/17 1500

## 2017-02-03 NOTE — ED Triage Notes (Signed)
Pt states she haas had dental issues x 2 months has 2 left upper and left l;ower  And then one rt lower and one is broken, states has to be in court by 2 pm

## 2017-03-08 ENCOUNTER — Encounter (HOSPITAL_COMMUNITY): Payer: Self-pay | Admitting: *Deleted

## 2017-03-08 ENCOUNTER — Emergency Department (HOSPITAL_COMMUNITY)
Admission: EM | Admit: 2017-03-08 | Discharge: 2017-03-08 | Disposition: A | Discharge status: Home / Self Care | Payer: Self-pay | Attending: Emergency Medicine | Admitting: Emergency Medicine

## 2017-03-08 DIAGNOSIS — K0889 Other specified disorders of teeth and supporting structures: Secondary | ICD-10-CM | POA: Insufficient documentation

## 2017-03-08 DIAGNOSIS — F17210 Nicotine dependence, cigarettes, uncomplicated: Secondary | ICD-10-CM | POA: Insufficient documentation

## 2017-03-08 DIAGNOSIS — Z79899 Other long term (current) drug therapy: Secondary | ICD-10-CM | POA: Insufficient documentation

## 2017-03-08 DIAGNOSIS — I10 Essential (primary) hypertension: Secondary | ICD-10-CM | POA: Insufficient documentation

## 2017-03-08 DIAGNOSIS — J45909 Unspecified asthma, uncomplicated: Secondary | ICD-10-CM | POA: Insufficient documentation

## 2017-03-08 NOTE — ED Triage Notes (Signed)
PT was seen here in Feb for dental pain.  Was given referral but did not see dentist within 2 days and now dentist will not see her.  She already has an appointment with dentist, just needs a new referral.

## 2017-03-08 NOTE — ED Notes (Signed)
Declined W/C at D/C and was escorted to lobby by RN. 

## 2017-03-08 NOTE — ED Provider Notes (Signed)
[facility] Provider Note   CSN: [id number] Arrival date & time: 03/08/17  [id number]  By signing my name below, I, Marnette Burgessyan Andrew Long, attest that this documentation has been prepared under the direction and in the presence of Rainn Zupko, PA-C. Electronically Signed: Marnette Burgessyan Andrew Long, Scribe. 03/08/2017. 9:21 AM.  History   Chief Complaint Chief Complaint  Patient presents with  . Dental Pain   The history is provided by the patient and medical records. No language interpreter was used.    HPI Comments:  [name] is a 46 y.o. female with a PMHx of Asthma, Seizures, Anemia, and HTN, who presents to the Emergency Department seeking a new referral for a dentist. Pt reports she was seen at [facility] on 02/03/17 for this constant dental pain but did not see Dr. Mayford Knifeurner in the two days required by the referral. She reports she does not have a PCP or Dentist currently. She took a full course of Penicillin in February but states the mild, generalized pain is still present in the left upper 1st molar and right upper mouth. Pt denies fever, chills, and any other complaints at this time. Pt states she called Dr. Malachy Moodurners office and has an apt for this afternoon but needs a referral to be seen.   Past Medical History:  Diagnosis Date  . Anemia   . Asthma   . Bronchitis   . Bronchitis    hx of  . Chronic constipation    hx of  . Chronic neck pain   . Hypertension    situational  . Seizures (HCC)    hx of, "not under treatment for at this time"  . Shortness of breath   . Urinary tract infection    hx of    There are no active problems to display for this patient.   Past Surgical History:  Procedure Laterality Date  . ABDOMINAL SURGERY    . ANTERIOR CERVICAL DECOMP/DISCECTOMY FUSION  12/09/2012   Procedure: ANTERIOR CERVICAL DECOMPRESSION/DISCECTOMY FUSION 1 LEVEL;  Surgeon: Reinaldo Meekerandy O Kritzer, MD;  Location: [facility];  Service: Neurosurgery;  Laterality: N/A;  Anterior  Cervical Decompression/Discectomy Fusion Cervical Five-Six     OB History    No data available      Home Medications    Prior to Admission medications   Medication Sig Start Date End Date Taking? Authorizing Provider  cephALEXin (KEFLEX) 500 MG capsule Take 1 capsule (500 mg total) by mouth 4 (four) times daily. 09/26/16   Tilden FossaElizabeth Rees, MD  hydrOXYzine (ATARAX/VISTARIL) 25 MG tablet Take 1 tablet (25 mg total) by mouth every 8 (eight) hours as needed for anxiety. 09/26/16   Tilden FossaElizabeth Rees, MD  ibuprofen (ADVIL,MOTRIN) 800 MG tablet Take 1 tablet (800 mg total) by mouth every 8 (eight) hours as needed for moderate pain. 02/03/17   Fayrene HelperBowie Tran, PA-C  penicillin v potassium (VEETID) 500 MG tablet Take 1 tablet (500 mg total) by mouth 3 (three) times daily. 02/03/17   Fayrene HelperBowie Tran, PA-C  Tetrahydrozoline HCl (VISINE OP) Apply 1-2 drops to eye daily as needed (dry eyes.).    Historical Provider, MD   Family History No family history on file.  Social History Social History  Substance Use Topics  . Smoking status: Current Some Day Smoker    Years: 8.00    Types: Cigarettes  . Smokeless tobacco: Never Used     Comment: "off and on"  . Alcohol use Yes     Comment: "social"  Allergies   Patient has no known allergies.   Review of Systems Review of Systems  Constitutional: Negative for chills and fever.  HENT: Positive for dental problem.      Physical Exam Updated Vital Signs BP (!) 150/100 (BP Location: Right Arm)   Pulse 82   Temp 98.1 F (36.7 C) (Oral)   Resp 16   Ht 5\' 4"  (1.626 m)   Wt 170 lb (77.1 kg)   LMP 02/22/2017   SpO2 98%   BMI 29.18 kg/m   Physical Exam  Constitutional: She is oriented to person, place, and time. She appears well-developed and well-nourished.  HENT:  Head: Normocephalic.  Poor dentition. Large carious in the left upper first molar and right upper second premolar. No surrounding gum swelling. No obvious abscess or infection. No  trismus. No swelling under the tongue.  Eyes: Conjunctivae are normal.  Cardiovascular: Normal rate.   Pulmonary/Chest: Effort normal.  Abdominal: She exhibits no distension.  Musculoskeletal: Normal range of motion.  Neurological: She is alert and oriented to person, place, and time.  Skin: Skin is warm and dry.  Psychiatric: She has a normal mood and affect.  Nursing note and vitals reviewed.    ED Treatments / Results  DIAGNOSTIC STUDIES:  Oxygen Saturation is 98% on RA, normal by my interpretation.    COORDINATION OF CARE:  9:21 AM Discussed treatment plan with pt at bedside including a referral and pt agreed to plan.  Labs (all labs ordered are listed, but only abnormal results are displayed) Labs Reviewed - No data to display  EKG  EKG Interpretation None       Radiology No results found.  Procedures Procedures (including critical care time)  Medications Ordered in ED Medications - No data to display   Initial Impression / Assessment and Plan / ED Course  I have reviewed the triage vital signs and the nursing notes.  Pertinent labs & imaging results that were available during my care of the patient were reviewed by me and considered in my medical decision making (see chart for details).     Patient with toothache.  No gross abscess.  Exam unconcerning for Ludwig's angina or spread of infection. Urged patient to follow-up with dentist.   Referrral provided upon request of patient.   Vitals:   03/08/17 0848 03/08/17 0849  BP: (!) 150/100   Pulse: 82   Resp: 16   Temp: 98.1 F (36.7 C)   TempSrc: Oral   SpO2: 98%   Weight:  77.1 kg  Height:  5\' 4"  (1.626 m)     Final Clinical Impressions(s) / ED Diagnoses   Final diagnoses:  Pain, dental    New Prescriptions New Prescriptions   No medications on file   I personally performed the services described in this documentation, which was scribed in my presence. The recorded information has been  reviewed and is accurate.     Jaynie Crumble, PA-C 03/08/17 1610    Raeford Razor, MD 03/08/17 [phone]

## 2017-03-08 NOTE — Discharge Instructions (Signed)
Ibuprofen or tylenol for pain. Follow up with a dentist.

## 2018-03-05 ENCOUNTER — Emergency Department (HOSPITAL_COMMUNITY): Payer: Self-pay

## 2018-03-05 ENCOUNTER — Emergency Department (HOSPITAL_COMMUNITY)
Admission: EM | Admit: 2018-03-05 | Discharge: 2018-03-05 | Disposition: A | Discharge status: Home / Self Care | Payer: Self-pay | Attending: Emergency Medicine | Admitting: Emergency Medicine

## 2018-03-05 ENCOUNTER — Encounter (HOSPITAL_COMMUNITY): Payer: Self-pay | Admitting: Emergency Medicine

## 2018-03-05 ENCOUNTER — Other Ambulatory Visit: Payer: Self-pay

## 2018-03-05 DIAGNOSIS — R0602 Shortness of breath: Secondary | ICD-10-CM | POA: Insufficient documentation

## 2018-03-05 DIAGNOSIS — J45909 Unspecified asthma, uncomplicated: Secondary | ICD-10-CM | POA: Insufficient documentation

## 2018-03-05 DIAGNOSIS — Z79899 Other long term (current) drug therapy: Secondary | ICD-10-CM | POA: Insufficient documentation

## 2018-03-05 DIAGNOSIS — F17210 Nicotine dependence, cigarettes, uncomplicated: Secondary | ICD-10-CM | POA: Insufficient documentation

## 2018-03-05 DIAGNOSIS — I10 Essential (primary) hypertension: Secondary | ICD-10-CM | POA: Insufficient documentation

## 2018-03-05 LAB — CBC WITH DIFFERENTIAL/PLATELET
Basophils Absolute: 0 10*3/uL (ref 0.0–0.1)
Basophils Relative: 0 %
Eosinophils Absolute: 0.1 10*3/uL (ref 0.0–0.7)
Eosinophils Relative: 1 %
HCT: 33.6 % — ABNORMAL LOW (ref 36.0–46.0)
HEMOGLOBIN: 10.8 g/dL — AB (ref 12.0–15.0)
LYMPHS ABS: 2.6 10*3/uL (ref 0.7–4.0)
LYMPHS PCT: 35 %
MCH: 27.1 pg (ref 26.0–34.0)
MCHC: 32.1 g/dL (ref 30.0–36.0)
MCV: 84.2 fL (ref 78.0–100.0)
Monocytes Absolute: 0.4 10*3/uL (ref 0.1–1.0)
Monocytes Relative: 5 %
NEUTROS PCT: 59 %
Neutro Abs: 4.5 10*3/uL (ref 1.7–7.7)
Platelets: 328 10*3/uL (ref 150–400)
RBC: 3.99 MIL/uL (ref 3.87–5.11)
RDW: 16.4 % — ABNORMAL HIGH (ref 11.5–15.5)
WBC: 7.6 10*3/uL (ref 4.0–10.5)

## 2018-03-05 LAB — BASIC METABOLIC PANEL
Anion gap: 8 (ref 5–15)
BUN: 7 mg/dL (ref 6–20)
CHLORIDE: 103 mmol/L (ref 101–111)
CO2: 24 mmol/L (ref 22–32)
Calcium: 9.2 mg/dL (ref 8.9–10.3)
Creatinine, Ser: 0.74 mg/dL (ref 0.44–1.00)
GFR calc non Af Amer: >60 mL/min (ref >60)
Glucose, Bld: 95 mg/dL (ref 65–99)
POTASSIUM: 3.9 mmol/L (ref 3.5–5.1)
SODIUM: 135 mmol/L (ref 135–145)

## 2018-03-05 MED ORDER — ALBUTEROL SULFATE HFA 108 (90 BASE) MCG/ACT IN AERS
2.0000 | INHALATION_SPRAY | RESPIRATORY_TRACT | Status: DC | PRN
  Administered 2018-03-05: 2 via RESPIRATORY_TRACT
  Filled 2018-03-05: qty 6.7

## 2018-03-05 NOTE — ED Notes (Signed)
Patient transported to X-ray 

## 2018-03-05 NOTE — ED Triage Notes (Signed)
Pt dropped off at hospital with complaints of shortness of breath, worsening at night and leg pain/numbness. Leg numbness started 2-3 weeks ago.

## 2018-03-05 NOTE — ED Provider Notes (Signed)
[facility] EMERGENCY DEPARTMENT Provider Note   CSN: [id number] Arrival date & time: 03/05/18  1914     History   Chief Complaint No chief complaint on file.   HPI [name] is a 47 y.o. female.  Patient is a 47 year old female with a history of asthma, hypertension and prior seizures who presents with shortness of breath.  She states over the last 2-3 months she has had some episodes of shortness of breath.  She mostly has these episodes at night where she feels like she has to gasp to breathe.  She does have some episodes during the daytime as well.  She does not really have true orthopnea per her description.  She denies any leg swelling.  She denies any chest pain.  No cough or chest congestion although over the last week she has had some URI symptoms which have improved somewhat.  She had some chills recently but no fevers.  She also complains of some numbness intermittently in her lower extremities bilaterally.  She says she feels weak in her legs but also feels weak in her arms.  She denies any unilateral symptoms.  No vision changes or speech deficits.  She denies any leg swelling.  No leg pain.  She does have some intermittent cramping to her legs but none currently.  She has been under a lot of stress recently and is having some problems with depression although she denies any thoughts of wanting to hurt herself or anyone else.     Past Medical History:  Diagnosis Date  . Anemia   . Asthma   . Bronchitis   . Bronchitis    hx of  . Chronic constipation    hx of  . Chronic neck pain   . Hypertension    situational  . Seizures (HCC)    hx of, "not under treatment for at this time"  . Shortness of breath   . Urinary tract infection    hx of    There are no active problems to display for this patient.   Past Surgical History:  Procedure Laterality Date  . ABDOMINAL SURGERY    . ANTERIOR CERVICAL DECOMP/DISCECTOMY FUSION  12/09/2012   Procedure: ANTERIOR CERVICAL DECOMPRESSION/DISCECTOMY FUSION 1 LEVEL;  Surgeon: Reinaldo Meeker, MD;  Location: [facility];  Service: Neurosurgery;  Laterality: N/A;  Anterior Cervical Decompression/Discectomy Fusion Cervical Five-Six      OB History   None      Home Medications    Prior to Admission medications   Medication Sig Start Date End Date Taking? Authorizing Provider  cephALEXin (KEFLEX) 500 MG capsule Take 1 capsule (500 mg total) by mouth 4 (four) times daily. 09/26/16   Tilden Fossa, MD  hydrOXYzine (ATARAX/VISTARIL) 25 MG tablet Take 1 tablet (25 mg total) by mouth every 8 (eight) hours as needed for anxiety. 09/26/16   Tilden Fossa, MD  ibuprofen (ADVIL,MOTRIN) 800 MG tablet Take 1 tablet (800 mg total) by mouth every 8 (eight) hours as needed for moderate pain. 02/03/17   Fayrene Helper, PA-C  penicillin v potassium (VEETID) 500 MG tablet Take 1 tablet (500 mg total) by mouth 3 (three) times daily. 02/03/17   Fayrene Helper, PA-C  Tetrahydrozoline HCl (VISINE OP) Apply 1-2 drops to eye daily as needed (dry eyes.).    [provider]    Family History No family history on file.  Social History Social History   Tobacco Use  . Smoking status: Current Some  Day Smoker    Years: 8.00    Types: Cigarettes  . Smokeless tobacco: Never Used  . Tobacco comment: "off and on"  Substance Use Topics  . Alcohol use: Yes    Comment: "social"  . Drug use: Yes    Types: "Crack" cocaine     Allergies   Patient has no known allergies.   Review of Systems Review of Systems  Constitutional: Positive for fatigue. Negative for chills, diaphoresis and fever.  HENT: Negative for congestion, rhinorrhea and sneezing.   Eyes: Negative.   Respiratory: Positive for shortness of breath. Negative for cough and chest tightness.   Cardiovascular: Negative for chest pain and leg swelling.  Gastrointestinal: Negative for abdominal pain, blood in stool, diarrhea, nausea and  vomiting.  Genitourinary: Negative for difficulty urinating, flank pain, frequency and hematuria.  Musculoskeletal: Negative for arthralgias and back pain.  Skin: Negative for rash.  Neurological: Positive for numbness. Negative for dizziness, speech difficulty, weakness and headaches.  Psychiatric/Behavioral: Positive for dysphoric mood. Negative for self-injury and suicidal ideas. The patient is nervous/anxious.      Physical Exam Updated Vital Signs BP (!) 147/96   Pulse 74   Temp 98.7 F (37.1 C) (Oral)   Resp (!) 24   Ht 5\' 3"  (1.6 m)   Wt 77.1 kg (170 lb)   LMP 02/14/2018   SpO2 100%   BMI 30.11 kg/m   Physical Exam  Constitutional: She is oriented to person, place, and time. She appears well-developed and well-nourished.  HENT:  Head: Normocephalic and atraumatic.  Eyes: Pupils are equal, round, and reactive to light.  Neck: Normal range of motion. Neck supple.  Cardiovascular: Normal rate, regular rhythm and normal heart sounds.  Pulmonary/Chest: Effort normal and breath sounds normal. No respiratory distress. She has no wheezes. She has no rales. She exhibits no tenderness.  Abdominal: Soft. Bowel sounds are normal. There is no tenderness. There is no rebound and no guarding.  Musculoskeletal: Normal range of motion. She exhibits no edema.  Patient has no edema or calf tenderness.  No color change to the lower extremities.  Pedal pulses are intact.  She has normal sensation to light touch bilaterally.  Lymphadenopathy:    She has no cervical adenopathy.  Neurological: She is alert and oriented to person, place, and time.  Motor 5/5 all extremities Sensation grossly intact to LT all extremities Finger to Nose intact, no pronator drift CN II-XII grossly intact    Skin: Skin is warm and dry. No rash noted.  Psychiatric: She has a normal mood and affect.     ED Treatments / Results  Labs (all labs ordered are listed, but only abnormal results are  displayed) Labs Reviewed  CBC WITH DIFFERENTIAL/PLATELET - Abnormal; Notable for the following components:      Result Value   Hemoglobin 10.8 (*)    HCT 33.6 (*)    RDW 16.4 (*)    All other components within normal limits  BASIC METABOLIC PANEL    EKG EKG Interpretation  Date/Time:  Saturday March 05 2018 19:17:57 EDT Ventricular Rate:  98 PR Interval:  182 QRS Duration: 76 QT Interval:  338 QTC Calculation: 431 R Axis:   11 Text Interpretation:  Normal sinus rhythm Septal infarct , age undetermined Abnormal ECG since last tracing no significant change Confirmed by Rolan BuccoBelfi, Devere Brem [phone]([id number]) on 03/05/2018 9:16:40 PM   Radiology Dg Chest 2 View  Result Date: 03/05/2018 CLINICAL DATA:  Shortness of breath and left leg  pain for 2 weeks EXAM: CHEST - 2 VIEW COMPARISON:  09/26/2016 FINDINGS: The heart size and mediastinal contours are within normal limits. Both lungs are clear. The visualized skeletal structures are unremarkable. IMPRESSION: No active cardiopulmonary disease. Electronically Signed   By: Alcide Clever M.D.   On: 03/05/2018 20:09    Procedures Procedures (including critical care time)  Medications Ordered in ED Medications  albuterol (PROVENTIL HFA;VENTOLIN HFA) 108 (90 Base) MCG/ACT inhaler 2 puff (has no administration in time range)     Initial Impression / Assessment and Plan / ED Course  I have reviewed the triage vital signs and the nursing notes.  Pertinent labs & imaging results that were available during my care of the patient were reviewed by me and considered in my medical decision making (see chart for details).     Patient is a 47 year old female who has some intermittent episodes of shortness of breath.  It is nonexertional.  She has no associated chest pain.  No symptoms that sound more concerning for CHF or ACS.  She does not have any hypoxia or tachycardia.  There are no other symptoms that would be more suspicious for pulmonary embolus.  She did  have some leg cramping but no unilateral swelling or calf pain.  Her labs are non-concerning.  Her electrolytes are within normal limits.  She currently is asymptomatic.  Her chest x-ray is clear without evidence of pneumonia.  Her lungs are clear.  She did state that at times she has wheezing so I will dispense her with an albuterol MDI.  She currently is out of her inhaler.  She was given a list of resources for possible outpatient follow-up.  She does have a component of depression and anxiety although she denies any intention of self-harm.  She was discharged home in good condition.  Return precautions were given.  Final Clinical Impressions(s) / ED Diagnoses   Final diagnoses:  Shortness of breath    ED Discharge Orders    None       Rolan Bucco, MD 03/05/18 2301

## 2018-03-05 NOTE — ED Notes (Signed)
ED Provider at bedside. 

## 2018-04-22 ENCOUNTER — Emergency Department (HOSPITAL_COMMUNITY)
Admission: EM | Admit: 2018-04-22 | Discharge: 2018-04-22 | Disposition: A | Discharge status: Home / Self Care | Payer: Self-pay | Attending: Emergency Medicine | Admitting: Emergency Medicine

## 2018-04-22 ENCOUNTER — Encounter (HOSPITAL_COMMUNITY): Payer: Self-pay | Admitting: Emergency Medicine

## 2018-04-22 DIAGNOSIS — J45909 Unspecified asthma, uncomplicated: Secondary | ICD-10-CM | POA: Insufficient documentation

## 2018-04-22 DIAGNOSIS — I10 Essential (primary) hypertension: Secondary | ICD-10-CM | POA: Insufficient documentation

## 2018-04-22 DIAGNOSIS — L02412 Cutaneous abscess of left axilla: Secondary | ICD-10-CM | POA: Insufficient documentation

## 2018-04-22 DIAGNOSIS — F17210 Nicotine dependence, cigarettes, uncomplicated: Secondary | ICD-10-CM | POA: Insufficient documentation

## 2018-04-22 MED ORDER — HYDROCODONE-ACETAMINOPHEN 5-325 MG PO TABS: 1.0000 | ORAL_TABLET | Freq: Four times a day (QID) | ORAL | 0 refills | Status: DC | PRN

## 2018-04-22 MED ORDER — OXYCODONE-ACETAMINOPHEN 5-325 MG PO TABS
1.0000 | ORAL_TABLET | Freq: Once | ORAL | Status: AC
  Administered 2018-04-22: 1 via ORAL
  Filled 2018-04-22: qty 1

## 2018-04-22 MED ORDER — LIDOCAINE-EPINEPHRINE 2 %-1:200000 IJ SOLN
20.0000 mL | Freq: Once | INTRAMUSCULAR | Status: AC
Start: 2018-04-22 — End: 2018-04-22
  Administered 2018-04-22: 20 mL via INTRADERMAL
  Filled 2018-04-22: qty 20

## 2018-04-22 MED ORDER — CEPHALEXIN 500 MG PO CAPS: 500.0000 mg | ORAL_CAPSULE | Freq: Three times a day (TID) | ORAL | 0 refills | Status: DC

## 2018-04-22 MED ORDER — SULFAMETHOXAZOLE-TRIMETHOPRIM 800-160 MG PO TABS: 1.0000 | ORAL_TABLET | Freq: Two times a day (BID) | ORAL | 0 refills | Status: AC

## 2018-04-22 NOTE — ED Provider Notes (Signed)
[facility]-EMERGENCY DEPT Provider Note   CSN: [id number] Arrival date & time: 04/22/18  1427     History   Chief Complaint Chief Complaint  Patient presents with  . Recurrent Skin Infections    HPI [name] is a 47 y.o. female.  HPI   48 year old female presenting for evaluation of skin infection.  Patient report having recurrent boils involving her left armpit.  For the past 2 to 3 weeks she noticed the same swelling and pain with drainage similar to problems.  It has become increasingly more painful in the affected area worse with movement or with palpation.  Pain is currently moderate to severe.  She is up-to-date with her tetanus.  She denies fever chills or numbness.  She denies any injury.  Past Medical History:  Diagnosis Date  . Anemia   . Asthma   . Bronchitis   . Bronchitis    hx of  . Chronic constipation    hx of  . Chronic neck pain   . Hypertension    situational  . Seizures (HCC)    hx of, "not under treatment for at this time"  . Shortness of breath   . Urinary tract infection    hx of    There are no active problems to display for this patient.   Past Surgical History:  Procedure Laterality Date  . ABDOMINAL SURGERY    . ANTERIOR CERVICAL DECOMP/DISCECTOMY FUSION  12/09/2012   Procedure: ANTERIOR CERVICAL DECOMPRESSION/DISCECTOMY FUSION 1 LEVEL;  Surgeon: Reinaldo Meeker, MD;  Location: [facility];  Service: Neurosurgery;  Laterality: N/A;  Anterior Cervical Decompression/Discectomy Fusion Cervical Five-Six      OB History   None      Home Medications    Prior to Admission medications   Medication Sig Start Date End Date Taking? Authorizing Provider  cephALEXin (KEFLEX) 500 MG capsule Take 1 capsule (500 mg total) by mouth 4 (four) times daily. 09/26/16   Tilden Fossa, MD  hydrOXYzine (ATARAX/VISTARIL) 25 MG tablet Take 1 tablet (25 mg total) by mouth every 8 (eight) hours as needed for anxiety. 09/26/16    Tilden Fossa, MD  ibuprofen (ADVIL,MOTRIN) 800 MG tablet Take 1 tablet (800 mg total) by mouth every 8 (eight) hours as needed for moderate pain. 02/03/17   Fayrene Helper, PA-C  penicillin v potassium (VEETID) 500 MG tablet Take 1 tablet (500 mg total) by mouth 3 (three) times daily. 02/03/17   Fayrene Helper, PA-C  Tetrahydrozoline HCl (VISINE OP) Apply 1-2 drops to eye daily as needed (dry eyes.).    [provider]    Family History No family history on file.  Social History Social History   Tobacco Use  . Smoking status: Current Some Day Smoker    Years: 8.00    Types: Cigarettes  . Smokeless tobacco: Never Used  . Tobacco comment: "off and on"  Substance Use Topics  . Alcohol use: Yes    Comment: "social"  . Drug use: Yes    Types: "Crack" cocaine     Allergies   Patient has no known allergies.   Review of Systems Review of Systems  Constitutional: Negative for fever.  Respiratory: Negative for shortness of breath.   Cardiovascular: Negative for chest pain.  Neurological: Negative for numbness.     Physical Exam Updated Vital Signs BP (!) 178/117 (BP Location: Right Arm)   Pulse 100   Temp 98.1 F (36.7 C) (Oral)   Resp 20  Ht  (1.6 m)   Wt 81.6 kg (180 lb)   LMP 04/15/2018   SpO2 97%   BMI 31.89 kg/m   Physical Exam  Constitutional: She appears well-developed and well-nourished. No distress.  HENT:  Head: Atraumatic.  Eyes: Conjunctivae are normal.  Neck: Neck supple.  Musculoskeletal: She exhibits tenderness (Left axillary region: An area of induration and fluctuant with surrounding skin erythema noted to the left axillary fold is grossly tender to palpation and no discharge noted.).  Neurological: She is alert.  Skin: No rash noted.  Psychiatric: She has a normal mood and affect.  Nursing note and vitals reviewed.    ED Treatments / Results  Labs (all labs ordered are listed, but only abnormal results are displayed) Labs  Reviewed - No data to display  EKG None  Radiology No results found.  Procedures .Marland KitchenIncision and Drainage Date/Time: 04/22/2018 4:08 PM Performed by: Fayrene Helper, PA-C Authorized by: Fayrene Helper, PA-C   Consent:    Consent obtained:  Verbal   Consent given by:  Patient   Risks discussed:  Incomplete drainage   Alternatives discussed:  No treatment Location:    Type:  Abscess   Size:  2x4cm   Location:  Upper extremity   Upper extremity location: L axillary  Pre-procedure details:    Skin preparation:  Betadine Anesthesia (see MAR for exact dosages):    Anesthesia method:  Local infiltration   Local anesthetic:  Lidocaine 2% WITH epi Procedure type:    Complexity:  Simple Procedure details:    Incision types:  Stab incision   Incision depth:  Subcutaneous   Scalpel blade:  11   Wound management:  Probed and deloculated   Drainage:  Purulent   Drainage amount:  Moderate   Wound treatment:  Wound left open   Packing materials:  1/4 in gauze   Amount 1/4":  8 Post-procedure details:    Patient tolerance of procedure:  Tolerated with difficulty   (including critical care time)  Medications Ordered in ED Medications - No data to display   Initial Impression / Assessment and Plan / ED Course  I have reviewed the triage vital signs and the nursing notes.  Pertinent labs & imaging results that were available during my care of the patient were reviewed by me and considered in my medical decision making (see chart for details).     BP (!) 178/117 (BP Location: Right Arm)   Pulse 100   Temp 98.1 F (36.7 C) (Oral)   Resp 20   Ht  (1.6 m)   Wt 81.6 kg (180 lb)   LMP 04/15/2018   SpO2 97%   BMI 31.89 kg/m    Final Clinical Impressions(s) / ED Diagnoses   Final diagnoses:  Abscess of left axilla    ED Discharge Orders        Ordered    HYDROcodone-acetaminophen (NORCO/VICODIN) 5-325 MG tablet  Every 6 hours PRN     04/22/18 1613    cephALEXin  (KEFLEX) 500 MG capsule  3 times daily     04/22/18 1613    sulfamethoxazole-trimethoprim (BACTRIM DS,SEPTRA DS) 800-160 MG tablet  2 times daily     04/22/18 1613     4:10 PM Pt with L axillary abscess, successful I&D by me, with moderate amount of purulent discharge. Packing placed.  Will need removal in 2 days.  Pt d/c home with keflex/bactrim and vicodin.  Warm compress recommended, return precaution given. In order to  decrease risk of narcotic abuse. Pt's record were checked using the [place] Controlled Substance database. tdap UTD    Fayrene Helper, PA-C 04/22/18 1614    Samuel Jester, DO 04/24/18 1321

## 2018-04-22 NOTE — Discharge Instructions (Signed)
Please have your packing remove in 2-3 days.  Take antibiotics as prescribed.  Apply warm compress several times daily.

## 2018-04-22 NOTE — ED Triage Notes (Signed)
Reports having recurrent boils in left axilla and had to have lanced before. Pt in tears over the pain and states that she has been trying to doctor on them for week now. Believes is draining.

## 2018-04-22 NOTE — ED Notes (Signed)
Pt is very weepy due to pain. PA advised. Orders received

## 2018-04-24 ENCOUNTER — Emergency Department (HOSPITAL_COMMUNITY)
Admission: EM | Admit: 2018-04-24 | Discharge: 2018-04-24 | Disposition: A | Discharge status: Home / Self Care | Payer: Self-pay | Attending: Physician Assistant | Admitting: Physician Assistant

## 2018-04-24 ENCOUNTER — Encounter (HOSPITAL_COMMUNITY): Payer: Self-pay | Admitting: Emergency Medicine

## 2018-04-24 ENCOUNTER — Other Ambulatory Visit: Payer: Self-pay

## 2018-04-24 DIAGNOSIS — J45909 Unspecified asthma, uncomplicated: Secondary | ICD-10-CM | POA: Insufficient documentation

## 2018-04-24 DIAGNOSIS — I10 Essential (primary) hypertension: Secondary | ICD-10-CM | POA: Insufficient documentation

## 2018-04-24 DIAGNOSIS — Z79899 Other long term (current) drug therapy: Secondary | ICD-10-CM | POA: Insufficient documentation

## 2018-04-24 DIAGNOSIS — Z5189 Encounter for other specified aftercare: Secondary | ICD-10-CM

## 2018-04-24 DIAGNOSIS — F17210 Nicotine dependence, cigarettes, uncomplicated: Secondary | ICD-10-CM | POA: Insufficient documentation

## 2018-04-24 DIAGNOSIS — L02411 Cutaneous abscess of right axilla: Secondary | ICD-10-CM | POA: Insufficient documentation

## 2018-04-24 DIAGNOSIS — R030 Elevated blood-pressure reading, without diagnosis of hypertension: Secondary | ICD-10-CM

## 2018-04-24 NOTE — ED Provider Notes (Signed)
[facility]-EMERGENCY DEPT Provider Note   CSN: [id number] Arrival date & time: 04/24/18  1342     History   Chief Complaint Chief Complaint  Patient presents with  . Wound Check    HPI [name] is a 47 y.o. female who presents to the ED for wound check and packing removal from an abscess of the left axilla that was drained 2 days ago. Patient reports the area has continued to drain. Patient reports some constipation after starting the medication.  HPI  Past Medical History:  Diagnosis Date  . Anemia   . Asthma   . Bronchitis   . Bronchitis    hx of  . Chronic constipation    hx of  . Chronic neck pain   . Hypertension    situational  . Seizures (HCC)    hx of, "not under treatment for at this time"  . Shortness of breath   . Urinary tract infection    hx of    There are no active problems to display for this patient.   Past Surgical History:  Procedure Laterality Date  . ABDOMINAL SURGERY    . ANTERIOR CERVICAL DECOMP/DISCECTOMY FUSION  12/09/2012   Procedure: ANTERIOR CERVICAL DECOMPRESSION/DISCECTOMY FUSION 1 LEVEL;  Surgeon: Reinaldo Meeker, MD;  Location: [facility];  Service: Neurosurgery;  Laterality: N/A;  Anterior Cervical Decompression/Discectomy Fusion Cervical Five-Six      OB History   None      Home Medications    Prior to Admission medications   Medication Sig Start Date End Date Taking? Authorizing Provider  calcium carbonate (TUMS - DOSED IN MG ELEMENTAL CALCIUM) 500 MG chewable tablet Chew 1 tablet by mouth 2 (two) times daily.    [provider]  cephALEXin (KEFLEX) 500 MG capsule Take 1 capsule (500 mg total) by mouth 3 (three) times daily. 04/22/18   Fayrene Helper, PA-C  diphenhydrAMINE (BENADRYL) 25 mg capsule Take 25 mg by mouth every 8 (eight) hours as needed for allergies.    [provider]  HYDROcodone-acetaminophen (NORCO/VICODIN) 5-325 MG tablet Take 1 tablet by mouth every 6 (six)  hours as needed for moderate pain or severe pain. 04/22/18   Fayrene Helper, PA-C  ibuprofen (ADVIL,MOTRIN) 200 MG tablet Take 800 mg by mouth every 6 (six) hours as needed for moderate pain.    [provider]  sulfamethoxazole-trimethoprim (BACTRIM DS,SEPTRA DS) 800-160 MG tablet Take 1 tablet by mouth 2 (two) times daily for 7 days. 04/22/18 04/29/18  Fayrene Helper, PA-C    Family History Family History  Problem Relation Age of Onset  . Heart failure Mother   . Cancer Father     Social History Social History   Tobacco Use  . Smoking status: Current Some Day Smoker    Years: 8.00    Types: Cigarettes  . Smokeless tobacco: Never Used  . Tobacco comment: "off and on"  Substance Use Topics  . Alcohol use: Yes    Comment: "social"  . Drug use: Yes    Types: "Crack" cocaine     Allergies   Patient has no known allergies.   Review of Systems Review of Systems  Gastrointestinal: Positive for constipation.  Skin: Positive for wound.  All other systems reviewed and are negative.    Physical Exam Updated Vital Signs BP (!) 153/110 (BP Location: Left Arm)   Pulse 75   Temp 98.3 F (36.8 C) (Oral)   Wt 81.6 kg (180 lb)  LMP 04/15/2018   SpO2 98%   BMI 31.89 kg/m   Physical Exam  Constitutional: She appears well-developed and well-nourished. No distress.  HENT:  Head: Normocephalic.  Eyes: EOM are normal.  Neck: Neck supple.  Cardiovascular: Normal rate.  Pulmonary/Chest: Effort normal.  Abdominal: Soft. There is no tenderness.  Musculoskeletal:  Right axilla with packing in place s/p I&D of abscess 2 days ago.   Neurological: She is alert.  Skin: Skin is warm and dry.  Psychiatric: She has a normal mood and affect.  Nursing note and vitals reviewed.    ED Treatments / Results  Labs (all labs ordered are listed, but only abnormal results are displayed) Labs Reviewed - No data to display Radiology No results found.  Procedures Wound  packing Date/Time: 04/24/2018 2:20 PM Performed by: Janne Napoleon, NP Authorized by: Janne Napoleon, NP  Consent: Verbal consent obtained. Consent given by: patient Patient understanding: patient states understanding of the procedure being performed Required items: required blood products, implants, devices, and special equipment available Patient identity confirmed: verbally with patient Local anesthesia used: no  Anesthesia: Local anesthesia used: no  Sedation: Patient sedated: no  Patient tolerance: Patient tolerated the procedure well with no immediate complications Comments: Packing removed from the abscess of the left axilla without difficulty. Dressing applied due to continued draining of the area.     (including critical care time)  Medications Ordered in ED Medications - No data to display   Initial Impression / Assessment and Plan / ED Course  I have reviewed the triage vital signs and the nursing notes. 47 y.o. female here for wound check and packing removal stable for d/c without fever or red streaking. Discussed with the patient elevated BP and need for follow up. Patient agrees with plan.   Final Clinical Impressions(s) / ED Diagnoses   Final diagnoses:  Wound check, abscess  Elevated blood pressure reading    ED Discharge Orders    None       Kerrie Buffalo [place], [place] 04/24/18 1422    Abelino Derrick, MD 04/27/18 1512

## 2018-04-24 NOTE — Discharge Instructions (Addendum)
Apply warm wet compresses to the area several times a day. Continue your medications. Return as needed for any problems.

## 2018-04-24 NOTE — ED Triage Notes (Signed)
Pt had an incision and drainage under l/axilla. Pt is into have a wound check

## 2018-08-31 ENCOUNTER — Ambulatory Visit (INDEPENDENT_AMBULATORY_CARE_PROVIDER_SITE_OTHER): Payer: Self-pay

## 2018-08-31 ENCOUNTER — Ambulatory Visit (HOSPITAL_COMMUNITY)
Admission: EM | Admit: 2018-08-31 | Discharge: 2018-08-31 | Disposition: A | Discharge status: Home / Self Care | Payer: Self-pay | Attending: Family Medicine | Admitting: Family Medicine

## 2018-08-31 ENCOUNTER — Encounter (HOSPITAL_COMMUNITY): Payer: Self-pay | Admitting: Emergency Medicine

## 2018-08-31 DIAGNOSIS — J181 Lobar pneumonia, unspecified organism: Secondary | ICD-10-CM

## 2018-08-31 DIAGNOSIS — R030 Elevated blood-pressure reading, without diagnosis of hypertension: Secondary | ICD-10-CM

## 2018-08-31 DIAGNOSIS — J189 Pneumonia, unspecified organism: Secondary | ICD-10-CM

## 2018-08-31 DIAGNOSIS — R05 Cough: Secondary | ICD-10-CM

## 2018-08-31 MED ORDER — ALBUTEROL SULFATE HFA 108 (90 BASE) MCG/ACT IN AERS
INHALATION_SPRAY | RESPIRATORY_TRACT | Status: AC
  Filled 2018-08-31: qty 6.7

## 2018-08-31 MED ORDER — ALBUTEROL SULFATE HFA 108 (90 BASE) MCG/ACT IN AERS
2.0000 | INHALATION_SPRAY | Freq: Once | RESPIRATORY_TRACT | Status: AC
  Administered 2018-08-31: 2 via RESPIRATORY_TRACT

## 2018-08-31 MED ORDER — AZITHROMYCIN 250 MG PO TABS: 250.0000 mg | ORAL_TABLET | Freq: Every day | ORAL | 0 refills | Status: DC

## 2018-08-31 NOTE — ED Triage Notes (Signed)
Pt c/o cough and congestion x1 week with some wheezing.

## 2018-08-31 NOTE — Discharge Instructions (Addendum)
X-rays showed a pneumonia Inhaler given in office Get plenty of rest and push fluids Use OTC medications as needed for symptomatic relief of fever and body aches Azithromycin prescribed.  Take as prescribed and to completion.   Take antibiotic as directed and to completion [facility] symptoms persist Return or go to ER if you have any new or worsening symptoms   Recommend repeat x-ray in 6 weeks to exclude underlying malignancy  Blood pressure elevated in office.  Please recheck in 24 hours.  If it continues to be greater than 140/90 please follow up with PCP for further evaluation and management.

## 2018-08-31 NOTE — ED Provider Notes (Signed)
[facility]   [id number] 08/31/18 Arrival Time: 0941  CC: cough   SUBJECTIVE:  [name] is a 47 y.o. female hx of asthma, tobacco use x 15 years who presents with worsening cough x1 week.  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  Describes cough as intermittent and productive with yellow/green sputum.  Has tried dayquil/nyquil with relief.  Symptoms are made worse with going to sleep.  Reports previous symptoms in the past and diagnosed with bronchitis.  Complains of chills, fatigue, sinus pain/pressure, rhinorrhea, wheezing, and SOB.  Denies fever, sinus pain, rhinorrhea, sore throat, chest pain, nausea, changes in bowel or bladder habits.    ROS: As per HPI.  Past Medical History:  Diagnosis Date  . Anemia   . Asthma   . Bronchitis   . Bronchitis    hx of  . Chronic constipation    hx of  . Chronic neck pain   . Hypertension    situational  . Seizures (HCC)    hx of, "not under treatment for at this time"  . Shortness of breath   . Urinary tract infection    hx of   Past Surgical History:  Procedure Laterality Date  . ABDOMINAL SURGERY    . ANTERIOR CERVICAL DECOMP/DISCECTOMY FUSION  12/09/2012   Procedure: ANTERIOR CERVICAL DECOMPRESSION/DISCECTOMY FUSION 1 LEVEL;  Surgeon: Reinaldo Meeker, MD;  Location: [facility];  Service: Neurosurgery;  Laterality: N/A;  Anterior Cervical Decompression/Discectomy Fusion Cervical Five-Six    No Known Allergies No current facility-administered medications on file prior to encounter.    Current Outpatient Medications on File Prior to Encounter  Medication Sig Dispense Refill  . calcium carbonate (TUMS - DOSED IN MG ELEMENTAL CALCIUM) 500 MG chewable tablet Chew 1 tablet by mouth 2 (two) times daily.    . diphenhydrAMINE (BENADRYL) 25 mg capsule Take 25 mg by mouth every 8 (eight) hours as needed for allergies.    Marland Kitchen ibuprofen (ADVIL,MOTRIN) 200 MG tablet Take 800 mg by mouth every 6 (six) hours  as needed for moderate pain.      Social History   Socioeconomic History  . Marital status: Single    Spouse name: Not on file  . Number of children: Not on file  . Years of education: Not on file  . Highest education level: Not on file  Occupational History  . Not on file  Social Needs  . Financial resource strain: Not on file  . Food insecurity:    Worry: Not on file    Inability: Not on file  . Transportation needs:    Medical: Not on file    Non-medical: Not on file  Tobacco Use  . Smoking status: Current Some Day Smoker    Years: 8.00    Types: Cigarettes  . Smokeless tobacco: Never Used  . Tobacco comment: "off and on"  Substance and Sexual Activity  . Alcohol use: Yes    Comment: "social"  . Drug use: Yes    Types: "Crack" cocaine  . Sexual activity: Not on file  Lifestyle  . Physical activity:    Days per week: Not on file    Minutes per session: Not on file  . Stress: Not on file  Relationships  . Social connections:    Talks on phone: Not on file    Gets together: Not on file    Attends religious service: Not on file    Active member of club  or organization: Not on file    Attends meetings of clubs or organizations: Not on file    Relationship status: Not on file  . Intimate partner violence:    Fear of current or ex partner: Not on file    Emotionally abused: Not on file    Physically abused: Not on file    Forced sexual activity: Not on file  Other Topics Concern  . Not on file  Social History Narrative  . Not on file   Family History  Problem Relation Age of Onset  . Heart failure Mother   . Cancer Father      OBJECTIVE:  Vitals:   08/31/18 1025 08/31/18 1130  BP: (!) 169/109 (!) 191/95  Pulse: 78 69  Resp: (!) 22 20  Temp: 98.1 F (36.7 C) 98.2 F (36.8 C)  TempSrc:  Oral  SpO2: 100% 100%     General appearance: Alert; appears fatigued HEENT: PERRL.  EOM grossly intact.  clear rhinorrhea; tonsils nonerythematous, uvula  midline Neck: supple without LAD Lungs: Wheezes heard over RLL; speaking in full sentences without difficulty; no nasal flaring, no cyanosis, no extra effort for breathing Heart: regular rate and rhythm.  Radial pulses 2+ symmetrical bilaterally Skin: warm and dry Psychological: alert and cooperative; normal mood and affect  Diagnostic Studies:  Dg Chest 2 View  Result Date: 08/31/2018 CLINICAL DATA:  Shortness of breath with cough and wheezing.  Fever. EXAM: CHEST - 2 VIEW COMPARISON:  March 05, 2018 FINDINGS: There is a small focus of consolidation in the lateral right base. Lungs elsewhere clear. Heart size and pulmonary vascularity are normal. No adenopathy. There is postoperative change in the lower cervical region. IMPRESSION: Small area of infiltrate consistent with pneumonia lateral right base. Lungs elsewhere clear. Heart size normal. No adenopathy. These results will be called to the ordering clinician or representative by the Radiologist Assistant, and communication documented in the PACS or zVision Dashboard. Electronically Signed   By: Bretta Bang III M.D.   On: 08/31/2018 11:18    ASSESSMENT & PLAN:  1. Pneumonia of right lower lobe due to infectious organism (HCC)   2. Elevated blood pressure reading     Meds ordered this encounter  Medications  . azithromycin (ZITHROMAX) 250 MG tablet    Sig: Take 1 tablet (250 mg total) by mouth daily. Take first 2 tablets together, then 1 every day until finished.    Dispense:  6 tablet    Refill:  0    Order Specific Question:   Supervising Provider    Answer:   Isa Rankin [phone]  . albuterol (PROVENTIL HFA;VENTOLIN HFA) 108 (90 Base) MCG/ACT inhaler 2 puff   X-rays showed a pneumonia Inhaler given in office Get plenty of rest and push fluids Use OTC medications as needed for symptomatic relief of fever and body aches Azithromycin prescribed.  Take as prescribed and to completion.   Take antibiotic as directed  and to completion Follow up with PCP or [facility] if symptoms persist.  Instructed patient to follow up here if she was not feeling better in the next day or two.   Return or go to ER if you have any new or worsening symptoms   Recommend repeat x-ray in 6 weeks to exclude underlying malignancy  Blood pressure elevated in office.  Please recheck in 24 hours.  If it continues to be greater than 140/90 please follow up with PCP for further evaluation and management.  Reviewed expectations re: course of current medical issues. Questions answered. Outlined signs and symptoms indicating need for more acute intervention. Patient verbalized understanding. After Visit Summary given.          Rennis HardingWurst, Kailynne Ferrington, PA-C 08/31/18 1139

## 2018-09-05 ENCOUNTER — Ambulatory Visit (INDEPENDENT_AMBULATORY_CARE_PROVIDER_SITE_OTHER): Payer: Self-pay

## 2018-09-05 ENCOUNTER — Ambulatory Visit (HOSPITAL_COMMUNITY)
Admission: EM | Admit: 2018-09-05 | Discharge: 2018-09-05 | Disposition: A | Discharge status: Home / Self Care | Payer: Self-pay | Attending: Family Medicine | Admitting: Family Medicine

## 2018-09-05 ENCOUNTER — Encounter (HOSPITAL_COMMUNITY): Payer: Self-pay | Admitting: Emergency Medicine

## 2018-09-05 DIAGNOSIS — J181 Lobar pneumonia, unspecified organism: Secondary | ICD-10-CM

## 2018-09-05 DIAGNOSIS — J189 Pneumonia, unspecified organism: Secondary | ICD-10-CM

## 2018-09-05 MED ORDER — CETIRIZINE HCL 10 MG PO TABS: 10.0000 mg | ORAL_TABLET | Freq: Every day | ORAL | 0 refills | Status: DC

## 2018-09-05 MED ORDER — PREDNISONE 20 MG PO TABS: 40.0000 mg | ORAL_TABLET | Freq: Every day | ORAL | 0 refills | Status: AC

## 2018-09-05 NOTE — ED Provider Notes (Addendum)
[facility]    CSN: [id number] Arrival date & time: 09/05/18  1526     History   Chief Complaint Chief Complaint  Patient presents with  . Cough    HPI [name] is a 47 y.o. female.   [name] presents with complaints of persistent cough, congestion, fatigue, shortness of breath. Headache, right rib pain. States sometimes feels improved but then feels worse again. Occasional clear sputum production with cough. Was seen here 9/18 and provided with azithromycin for pneumonia. States has not improved. Uses inhaler occasionally but feels it makes her legs swell and increases her blood pressure. She is a smoker, hasn't been smoking since has been ill. Eating some, drinking fluids. Stools have been more loose since on antibiotics but otherwise no gi/gu complaints. Has not taken any other medications for symptoms. States there is mold at her house she recently moved into. Hx of anemia, asthma, bronchitis, htn, seizures, uti.     ROS per HPI.      Past Medical History:  Diagnosis Date  . Anemia   . Asthma   . Bronchitis   . Bronchitis    hx of  . Chronic constipation    hx of  . Chronic neck pain   . Hypertension    situational  . Seizures (HCC)    hx of, "not under treatment for at this time"  . Shortness of breath   . Urinary tract infection    hx of    There are no active problems to display for this patient.   Past Surgical History:  Procedure Laterality Date  . ABDOMINAL SURGERY    . ANTERIOR CERVICAL DECOMP/DISCECTOMY FUSION  12/09/2012   Procedure: ANTERIOR CERVICAL DECOMPRESSION/DISCECTOMY FUSION 1 LEVEL;  Surgeon: Reinaldo Meeker, MD;  Location: [facility];  Service: Neurosurgery;  Laterality: N/A;  Anterior Cervical Decompression/Discectomy Fusion Cervical Five-Six     OB History   None      Home Medications    Prior to Admission medications   Medication Sig Start Date End Date Taking? Authorizing Provider  azithromycin  (ZITHROMAX) 250 MG tablet Take 1 tablet (250 mg total) by mouth daily. Take first 2 tablets together, then 1 every day until finished. 08/31/18   Wurst, [place], PA-C  calcium carbonate (TUMS - DOSED IN MG ELEMENTAL CALCIUM) 500 MG chewable tablet Chew 1 tablet by mouth 2 (two) times daily.    [provider]  cetirizine (ZYRTEC) 10 MG tablet Take 1 tablet (10 mg total) by mouth daily. 09/05/18   Georgetta Haber, NP  diphenhydrAMINE (BENADRYL) 25 mg capsule Take 25 mg by mouth every 8 (eight) hours as needed for allergies.    [provider]  ibuprofen (ADVIL,MOTRIN) 200 MG tablet Take 800 mg by mouth every 6 (six) hours as needed for moderate pain.    [provider]  predniSONE (DELTASONE) 20 MG tablet Take 2 tablets (40 mg total) by mouth daily with breakfast for 5 days. 09/05/18 09/10/18  Georgetta Haber, NP    Family History Family History  Problem Relation Age of Onset  . Heart failure Mother   . Cancer Father     Social History Social History   Tobacco Use  . Smoking status: Current Some Day Smoker    Years: 8.00    Types: Cigarettes  . Smokeless tobacco: Never Used  . Tobacco comment: "off and on"  Substance Use Topics  . Alcohol use: Yes    Comment: "social"  .  Drug use: Yes    Types: "Crack" cocaine     Allergies   Patient has no known allergies.   Review of Systems Review of Systems   Physical Exam Triage Vital Signs ED Triage Vitals [09/05/18 1540]  Enc Vitals Group     BP (!) 159/93     Pulse Rate 81     Resp 20     Temp 98.1 F (36.7 C)     Temp Source Oral     SpO2 100 %     Weight      Height      Head Circumference      Peak Flow      Pain Score      Pain Loc      Pain Edu?      Excl. in GC?    No data found.  Updated Vital Signs BP (!) 159/93 (BP Location: Left Arm)   Pulse 81   Temp 98.1 F (36.7 C) (Oral)   Resp 20   LMP 08/17/2018 (Exact Date)   SpO2 100%    Physical Exam  Constitutional: She is  oriented to person, place, and time. She appears well-developed and well-nourished.  Tearful and anxious  HENT:  Head: Normocephalic and atraumatic.  Right Ear: Tympanic membrane, external ear and ear canal normal.  Left Ear: Tympanic membrane, external ear and ear canal normal.  Nose: Nose normal.  Mouth/Throat: Uvula is midline, oropharynx is clear and moist and mucous membranes are normal. No tonsillar exudate.  Eyes: Pupils are equal, round, and reactive to light. Conjunctivae and EOM are normal.  Cardiovascular: Normal rate, regular rhythm and normal heart sounds.  Pulmonary/Chest: Effort normal and breath sounds normal. No respiratory distress. She has no wheezes.  Lungs clear on auscultation, mildly decreased at bases; no wheezing or rhonchi; no cough throughout exam; no increased work of breathing  Neurological: She is alert and oriented to person, place, and time.  Skin: Skin is warm and dry.     UC Treatments / Results  Labs (all labs ordered are listed, but only abnormal results are displayed) Labs Reviewed - No data to display  EKG None  Radiology Dg Chest 2 View  Result Date: 09/05/2018 CLINICAL DATA:  Per pt: was here at the [facility] five days ago, was diagnosed with pneumonia, getting worse, not better. SOB, cough, still has a fever. Provider stated that since the patient is saying that she is worse, needs to know if the pneumonia is getting worse. EXAM: CHEST - 2 VIEW COMPARISON:  08/31/2018 FINDINGS: Heart size is normal. There is persistent minimal opacity at the LATERAL RIGHT lung base consistent with infiltrate or atelectasis. The appearance is stable compared with previous exam. LEFT lung is clear. No pulmonary edema. IMPRESSION: Stable opacity at the LATERAL RIGHT lung base, consistent with infiltrate. Followup PA and lateral chest X-ray is recommended in 3-4 weeks following trial of antibiotic therapy to ensure resolution and exclude underlying malignancy. Electronically  Signed   By: Norva Pavlov M.D.   On: 09/05/2018 16:20    Procedures Procedures (including critical care time)  Medications Ordered in UC Medications - No data to display  Initial Impression / Assessment and Plan / UC Course  I have reviewed the triage vital signs and the nursing notes.  Pertinent labs & imaging results that were available during my care of the patient were reviewed by me and considered in my medical decision making (see chart for details).  Just completed azithromycin. Chest xray stable without increasing infiltrate. No further antibiotics initiated. Prednisone provided to further aid with cough/ shortness of breath. Encourage decrease to quit smoking. Return precautions provided. If symptoms worsen or do not improve in the next week to return to be seen or to follow up with PCP.  Repeat cxry in 3-4 weeks to ensure resolution. Patient verbalized understanding and agreeable to plan.    Case reviewed with supervising physician dr. Milus GlazierLauenstein.  Final Clinical Impressions(s) / UC Diagnoses   Final diagnoses:  Community acquired pneumonia of right lower lobe of lung [facility](HCC)     Discharge Instructions     You xray is stable at this time. This is reassuring.  The antibiotics given have a long half life, therefore they are still working in your system.  There is no indication for further antibiotics at this time.  Continue with use of your inhaler as needed for wheezing or shortness of breath.  5 days of prednisone may help some with your cough and shortness of breath .  Please establish with a primary care provider for a recheck, will need repeat chest xray in 1 month to ensure resolution of this on xray.  If worsening of pain, shortness of breath  or fevers please return to be seen or go to the Er.     ED Prescriptions    Medication Sig Dispense Auth. Provider   predniSONE (DELTASONE) 20 MG tablet Take 2 tablets (40 mg total) by mouth daily with breakfast for 5  days. 10 tablet Linus MakoBurky, Raef Sprigg B, NP   cetirizine (ZYRTEC) 10 MG tablet Take 1 tablet (10 mg total) by mouth daily. 30 tablet Georgetta HaberBurky, Mahesh Sizemore B, NP     Controlled Substance Prescriptions [place] Controlled Substance Registry consulted? Not Applicable   Georgetta HaberBurky, Alysabeth Scalia B, NP 09/05/18 1640    Georgetta HaberBurky, Keldan Eplin B, NP 09/05/18 1640

## 2018-09-05 NOTE — ED Triage Notes (Signed)
Pt c/o cough x1 week given antibiotics and finished them, dx with pneumonia. States shes not any better

## 2018-09-05 NOTE — Discharge Instructions (Addendum)
You xray is stable at this time. This is reassuring.  The antibiotics given have a long half life, therefore they are still working in your system.  There is no indication for further antibiotics at this time.  Continue with use of your inhaler as needed for wheezing or shortness of breath.  5 days of prednisone may help some with your cough and shortness of breath .  Please establish with a primary care provider for a recheck, will need repeat chest xray in 1 month to ensure resolution of this on xray.  If worsening of pain, shortness of breath  or fevers please return to be seen or go to the Er.

## 2019-01-10 ENCOUNTER — Emergency Department (HOSPITAL_COMMUNITY)
Admission: EM | Admit: 2019-01-10 | Discharge: 2019-01-10 | Disposition: A | Discharge status: Home / Self Care | Payer: BLUE CROSS/BLUE SHIELD | Attending: Emergency Medicine | Admitting: Emergency Medicine

## 2019-01-10 ENCOUNTER — Other Ambulatory Visit: Payer: Self-pay

## 2019-01-10 ENCOUNTER — Encounter (HOSPITAL_COMMUNITY): Payer: Self-pay

## 2019-01-10 ENCOUNTER — Emergency Department (HOSPITAL_COMMUNITY): Payer: BLUE CROSS/BLUE SHIELD

## 2019-01-10 DIAGNOSIS — R0789 Other chest pain: Secondary | ICD-10-CM | POA: Diagnosis present

## 2019-01-10 DIAGNOSIS — J45909 Unspecified asthma, uncomplicated: Secondary | ICD-10-CM | POA: Diagnosis not present

## 2019-01-10 DIAGNOSIS — F17210 Nicotine dependence, cigarettes, uncomplicated: Secondary | ICD-10-CM | POA: Diagnosis not present

## 2019-01-10 DIAGNOSIS — M79601 Pain in right arm: Secondary | ICD-10-CM | POA: Diagnosis not present

## 2019-01-10 DIAGNOSIS — R030 Elevated blood-pressure reading, without diagnosis of hypertension: Secondary | ICD-10-CM | POA: Diagnosis not present

## 2019-01-10 DIAGNOSIS — I10 Essential (primary) hypertension: Secondary | ICD-10-CM | POA: Insufficient documentation

## 2019-01-10 DIAGNOSIS — R072 Precordial pain: Secondary | ICD-10-CM | POA: Diagnosis not present

## 2019-01-10 HISTORY — DX: Accidental discharge from unspecified firearms or gun, initial encounter: W34.00XA

## 2019-01-10 LAB — BASIC METABOLIC PANEL
Anion gap: 9 (ref 5–15)
BUN: 7 mg/dL (ref 6–20)
CO2: 22 mmol/L (ref 22–32)
CREATININE: 0.7 mg/dL (ref 0.44–1.00)
Calcium: 9 mg/dL (ref 8.9–10.3)
Chloride: 102 mmol/L (ref 98–111)
Glucose, Bld: 102 mg/dL — ABNORMAL HIGH (ref 70–99)
POTASSIUM: 4 mmol/L (ref 3.5–5.1)
SODIUM: 133 mmol/L — AB (ref 135–145)

## 2019-01-10 LAB — CBC
HCT: 35.7 % — ABNORMAL LOW (ref 36.0–46.0)
HEMOGLOBIN: 11.1 g/dL — AB (ref 12.0–15.0)
MCH: 27.8 pg (ref 26.0–34.0)
MCHC: 31.1 g/dL (ref 30.0–36.0)
MCV: 89.3 fL (ref 80.0–100.0)
NRBC: 0 % (ref 0.0–0.2)
Platelets: 336 10*3/uL (ref 150–400)
RBC: 4 MIL/uL (ref 3.87–5.11)
RDW: 17.1 % — AB (ref 11.5–15.5)
WBC: 6.1 10*3/uL (ref 4.0–10.5)

## 2019-01-10 LAB — D-DIMER, QUANTITATIVE: D-Dimer, Quant: <0.27 ug/mL-FEU (ref 0.00–0.50)

## 2019-01-10 LAB — I-STAT BETA HCG BLOOD, ED (MC, WL, AP ONLY)

## 2019-01-10 LAB — TROPONIN I

## 2019-01-10 MED ORDER — KETOROLAC TROMETHAMINE 30 MG/ML IJ SOLN
30.0000 mg | Freq: Once | INTRAMUSCULAR | Status: AC
  Administered 2019-01-10: 30 mg via INTRAVENOUS
  Filled 2019-01-10: qty 1

## 2019-01-10 MED ORDER — HYDROCHLOROTHIAZIDE 25 MG PO TABS: 25.0000 mg | ORAL_TABLET | Freq: Every day | ORAL | 0 refills | Status: DC

## 2019-01-10 MED ORDER — SODIUM CHLORIDE 0.9% FLUSH
3.0000 mL | Freq: Once | INTRAVENOUS | Status: AC
  Administered 2019-01-10: 3 mL via INTRAVENOUS

## 2019-01-10 MED ORDER — ALBUTEROL SULFATE HFA 108 (90 BASE) MCG/ACT IN AERS
2.0000 | INHALATION_SPRAY | Freq: Once | RESPIRATORY_TRACT | Status: AC
  Administered 2019-01-10: 2 via RESPIRATORY_TRACT
  Filled 2019-01-10: qty 6.7

## 2019-01-10 NOTE — ED Triage Notes (Signed)
Patient  reports intermittent chest pressure and tightness x 2 weeks. Patient states intermittent SOB with chest tightness Patient also c/o intermittent right arm pain x 2 days worse today. Patient states it hurts to raise her right arm above her head.
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
## 2019-01-10 NOTE — Discharge Instructions (Addendum)

## 2019-02-21 ENCOUNTER — Ambulatory Visit (HOSPITAL_COMMUNITY)
Admission: EM | Admit: 2019-02-21 | Discharge: 2019-02-21 | Disposition: A | Discharge status: Home / Self Care | Payer: BLUE CROSS/BLUE SHIELD | Attending: Emergency Medicine | Admitting: Emergency Medicine

## 2019-02-21 ENCOUNTER — Encounter (HOSPITAL_COMMUNITY): Payer: Self-pay | Admitting: Emergency Medicine

## 2019-02-21 DIAGNOSIS — H9202 Otalgia, left ear: Secondary | ICD-10-CM | POA: Diagnosis not present

## 2019-02-21 DIAGNOSIS — I159 Secondary hypertension, unspecified: Secondary | ICD-10-CM | POA: Insufficient documentation

## 2019-02-21 DIAGNOSIS — J0140 Acute pansinusitis, unspecified: Secondary | ICD-10-CM | POA: Diagnosis not present

## 2019-02-21 DIAGNOSIS — J029 Acute pharyngitis, unspecified: Secondary | ICD-10-CM | POA: Insufficient documentation

## 2019-02-21 LAB — POCT INFECTIOUS MONO SCREEN: Mono Screen: NEGATIVE

## 2019-02-21 LAB — POCT RAPID STREP A: Streptococcus, Group A Screen (Direct): NEGATIVE

## 2019-02-21 MED ORDER — CETIRIZINE HCL 10 MG PO TABS: 10.0000 mg | ORAL_TABLET | Freq: Every day | ORAL | 0 refills | Status: DC

## 2019-02-21 MED ORDER — HYDROCODONE-ACETAMINOPHEN 5-325 MG PO TABS: 1.0000 | ORAL_TABLET | ORAL | 0 refills | Status: DC | PRN

## 2019-02-21 MED ORDER — DEXAMETHASONE SODIUM PHOSPHATE 10 MG/ML IJ SOLN
INTRAMUSCULAR | Status: AC
  Filled 2019-02-21: qty 1

## 2019-02-21 MED ORDER — KETOROLAC TROMETHAMINE 30 MG/ML IJ SOLN
30.0000 mg | Freq: Once | INTRAMUSCULAR | Status: AC
  Administered 2019-02-21: 30 mg via INTRAMUSCULAR

## 2019-02-21 MED ORDER — ACETAMINOPHEN 325 MG PO TABS
ORAL_TABLET | ORAL | Status: AC
  Filled 2019-02-21: qty 3

## 2019-02-21 MED ORDER — AMOXICILLIN-POT CLAVULANATE 875-125 MG PO TABS: 1.0000 | ORAL_TABLET | Freq: Two times a day (BID) | ORAL | 0 refills | Status: DC

## 2019-02-21 MED ORDER — KETOROLAC TROMETHAMINE 30 MG/ML IJ SOLN
INTRAMUSCULAR | Status: AC
  Filled 2019-02-21: qty 1

## 2019-02-21 MED ORDER — ACETAMINOPHEN 325 MG PO TABS
975.0000 mg | ORAL_TABLET | Freq: Once | ORAL | Status: AC
  Administered 2019-02-21: 975 mg via ORAL

## 2019-02-21 MED ORDER — FLUTICASONE PROPIONATE 50 MCG/ACT NA SUSP: 2.0000 | Freq: Every day | NASAL | 0 refills | Status: DC

## 2019-02-21 MED ORDER — IBUPROFEN 600 MG PO TABS: 600.0000 mg | ORAL_TABLET | Freq: Four times a day (QID) | ORAL | 0 refills | Status: AC | PRN

## 2019-02-21 MED ORDER — DEXAMETHASONE SODIUM PHOSPHATE 10 MG/ML IJ SOLN
10.0000 mg | Freq: Once | INTRAMUSCULAR | Status: AC
  Administered 2019-02-21: 10 mg via INTRAMUSCULAR

## 2019-02-21 NOTE — ED Triage Notes (Signed)
Pt c/o sore throat and L ear pain since yesterday.  
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
## 2019-02-21 NOTE — Discharge Instructions (Addendum)
Decrease your salt intake. diet and exercise will lower your blood pressure significantly. It is important to keep your blood pressure under good control, as having a elevated blood pressure for prolonged periods of time significantly increases your risk of stroke, heart attacks, kidney damage, eye damage, and other problems. Measure your blood pressure once a day, preferably at the same time every day. Keep a log of this and bring it to your next doctor's appointment.  Bring your blood pressure cuff as well.  Return here in 10 days for blood pressure recheck if you're unable to find a primary care physician by then. Return immediately to the ER if you start having chest pain, headache, problems seeing, problems talking, problems walking, if you feel like you're about to pass out, if you do pass out, if you have a seizure, or for any other concerns.  your rapid strep was negative today, so we have sent off a throat culture.  However I am treating you as if you have strep throat.  This could be mono, but your mono test was negative today.  There is a high false-negative rate in the first week, so if you are still having a sore throat in a week, come back and we can retest you for mono.  Give [place] a working phone number.  600 mg ibuprofen combined with a Tylenol containing product together 3-4 times a day as needed for pain.  Ibuprofen with 1 g of Tylenol for mild to moderate pain, or ibuprofen with Norco for severe pain.  Do not take Tylenol and Norco to both have Tylenol in them and too much Tylenol can hurt your liver.  Do not exceed 4 g of Tylenol from all sources in 1 day. make sure you drink plenty of extra fluids.  Some people find salt water gargles and  Traditional Medicinal's "Throat Coat" tea helpful. Take 5 mL of liquid Benadryl and 5 mL of Maalox. Mix it together, and then hold it in your mouth for as long as you can and then swallow. You may do this 4 times a day.    Start some saline nasal irrigation  with a Lloyd Huger med rinse and distilled water as often as you want, Flonase, cetirizine.  This will treat your sinus infection.  Below is a list of primary care practices who are taking new patients for you to follow-up with.  [facility] at [facility] [address] [place], [place] [address] [phone]  [facility] [address] [place], [place] [address] [phone]  [facility] [phone] [address] [place] [address]  [facility]: [address] [place] [address]  [phone]  [facility]: [address] [place] [address]   [phone]  [facility]: [address] [place] [address]  [phone]  [place] [facility] : [address] [place] [address] [phone]  [facility]: [address] [place] [address] [phone]  [facility]: [address] [place] [address] [phone]  Dr. Oneal Grout [address] [facility] [place] [address]  [phone]  Dr. Jackie Plum, [facility]. [address] [place], [place] [address]  [phone]  Go to www.goodrx.com to look up your medications. This will give  you a list of where you can find your prescriptions at the most affordable prices. Or ask the pharmacist what the cash price is, or if they have any other discount programs available to help make your medication more affordable. This can be less expensive than what you would pay with insurance.

## 2019-02-24 LAB — CULTURE, GROUP A STREP (THRC)

## 2021-08-10 ENCOUNTER — Other Ambulatory Visit: Payer: Self-pay

## 2021-08-10 ENCOUNTER — Emergency Department (HOSPITAL_COMMUNITY)
Admission: EM | Admit: 2021-08-10 | Discharge: 2021-08-10 | Disposition: A | Discharge status: Home / Self Care | Payer: Self-pay | Attending: Emergency Medicine | Admitting: Emergency Medicine

## 2021-08-10 ENCOUNTER — Emergency Department (HOSPITAL_COMMUNITY): Payer: Self-pay

## 2021-08-10 ENCOUNTER — Encounter (HOSPITAL_COMMUNITY): Payer: Self-pay | Admitting: Emergency Medicine

## 2021-08-10 DIAGNOSIS — F419 Anxiety disorder, unspecified: Secondary | ICD-10-CM | POA: Insufficient documentation

## 2021-08-10 DIAGNOSIS — I10 Essential (primary) hypertension: Secondary | ICD-10-CM | POA: Insufficient documentation

## 2021-08-10 DIAGNOSIS — Z7951 Long term (current) use of inhaled steroids: Secondary | ICD-10-CM | POA: Insufficient documentation

## 2021-08-10 DIAGNOSIS — F17210 Nicotine dependence, cigarettes, uncomplicated: Secondary | ICD-10-CM | POA: Insufficient documentation

## 2021-08-10 DIAGNOSIS — R569 Unspecified convulsions: Secondary | ICD-10-CM | POA: Insufficient documentation

## 2021-08-10 DIAGNOSIS — J45909 Unspecified asthma, uncomplicated: Secondary | ICD-10-CM | POA: Insufficient documentation

## 2021-08-10 DIAGNOSIS — Z79899 Other long term (current) drug therapy: Secondary | ICD-10-CM | POA: Insufficient documentation

## 2021-08-10 DIAGNOSIS — R55 Syncope and collapse: Secondary | ICD-10-CM | POA: Insufficient documentation

## 2021-08-10 LAB — BASIC METABOLIC PANEL
Anion gap: 17 — ABNORMAL HIGH (ref 5–15)
BUN: 8 mg/dL (ref 6–20)
CO2: 24 mmol/L (ref 22–32)
Calcium: 9.3 mg/dL (ref 8.9–10.3)
Chloride: 97 mmol/L — ABNORMAL LOW (ref 98–111)
Creatinine, Ser: 1.18 mg/dL — ABNORMAL HIGH (ref 0.44–1.00)
GFR, Estimated: 57 mL/min — ABNORMAL LOW (ref >60)
Glucose, Bld: 104 mg/dL — ABNORMAL HIGH (ref 70–99)
Potassium: 3.2 mmol/L — ABNORMAL LOW (ref 3.5–5.1)
Sodium: 138 mmol/L (ref 135–145)

## 2021-08-10 LAB — CBC
HCT: 33.5 % — ABNORMAL LOW (ref 36.0–46.0)
Hemoglobin: 10.3 g/dL — ABNORMAL LOW (ref 12.0–15.0)
MCH: 22.7 pg — ABNORMAL LOW (ref 26.0–34.0)
MCHC: 30.7 g/dL (ref 30.0–36.0)
MCV: 73.8 fL — ABNORMAL LOW (ref 80.0–100.0)
Platelets: 252 10*3/uL (ref 150–400)
RBC: 4.54 MIL/uL (ref 3.87–5.11)
RDW: 22.1 % — ABNORMAL HIGH (ref 11.5–15.5)
WBC: 6.4 10*3/uL (ref 4.0–10.5)
nRBC: 0 % (ref 0.0–0.2)

## 2021-08-10 LAB — I-STAT BETA HCG BLOOD, ED (MC, WL, AP ONLY): I-stat hCG, quantitative: <5 m[IU]/mL (ref <5)

## 2021-08-10 LAB — CBG MONITORING, ED: Glucose-Capillary: 103 mg/dL — ABNORMAL HIGH (ref 70–99)

## 2021-08-10 LAB — TROPONIN I (HIGH SENSITIVITY): Troponin I (High Sensitivity): 8 ng/L (ref <18)

## 2021-08-10 MED ORDER — LORAZEPAM 2 MG/ML IJ SOLN
1.0000 mg | Freq: Once | INTRAMUSCULAR | Status: AC
  Administered 2021-08-10: 1 mg via INTRAVENOUS
  Filled 2021-08-10: qty 1

## 2021-08-10 MED ORDER — SODIUM CHLORIDE 0.9 % IV BOLUS
1000.0000 mL | Freq: Once | INTRAVENOUS | Status: AC
  Administered 2021-08-10: 1000 mL via INTRAVENOUS
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
## 2021-08-10 NOTE — Discharge Instructions (Signed)
Stay well-hydrated.  Get plenty of rest.  Follow-up with neurology for further seizure work-up.  Call 800 number to try to arrange for primary care follow-up.  I have also put in a referral for possible follow-up with our family practice group at [facility].  Please return if symptoms worsen.  Would recommend that you not partake in any dangerous activities including driving until you are cleared by neurology.

## 2021-08-10 NOTE — Progress Notes (Signed)
Transition of Care [facility]) - Emergency Department Mini Assessment   Patient Details  Name: [name] MRN: [id number] Date of Birth: 12-09-1971  [facility] of [facility]) CM/SW Contact:    Lossie Faes Tarpley-Carter, LCSWA Phone Number: 08/10/2021, 3:24 PM   Clinical Narrative: [facility] CSW consulted with pt.  Pt is in need of housing.  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 Pt has had a lot of hardships in the last few years and recently lost her goddaughter (who was buried today).  CSW provided pt with shelter list, and explained she would have to complete the assessment for acceptance.  Rindy Kollman Tarpley-Carter, MSW, LCSW-A Pronouns:  She/Her/Hers [facility] Clinical Social Worker Direct Number:  [phone] [email or web address]  ED Mini Assessment: What brought you to the Emergency Department? : Syncope  Barriers to Discharge: No Barriers Identified     Means of departure: Not know  Interventions which prevented an admission or readmission: Homeless Screening    Patient Contact and Communications       Contact Date: 08/10/21,          Patient states their goals for this hospitalization and ongoing recovery are:: In need of shelter.   Choice offered to / list presented to : NA  Admission diagnosis:  Syncope There are no problems to display for this patient.  PCP:  Patient, No Pcp Per (Inactive) Pharmacy:   [facility] [phone] - [place], [place] - [phone] [facility] [address] AT [facility] OF [address] [place] [address] Phone: [phone] Fax: [phone]

## 2021-08-20 ENCOUNTER — Ambulatory Visit: Payer: Self-pay | Admitting: Neurology

## 2021-09-30 ENCOUNTER — Encounter: Payer: Self-pay | Admitting: Neurology

## 2021-09-30 ENCOUNTER — Ambulatory Visit: Payer: Self-pay | Admitting: Neurology

## 2022-02-23 ENCOUNTER — Encounter (HOSPITAL_COMMUNITY): Payer: Self-pay

## 2022-02-23 ENCOUNTER — Emergency Department (HOSPITAL_COMMUNITY)
Admission: EM | Admit: 2022-02-23 | Discharge: 2022-02-23 | Disposition: A | Discharge status: Home / Self Care | Payer: 59 | Attending: Emergency Medicine | Admitting: Emergency Medicine

## 2022-02-23 ENCOUNTER — Emergency Department (HOSPITAL_COMMUNITY): Payer: 59

## 2022-02-23 DIAGNOSIS — H1089 Other conjunctivitis: Secondary | ICD-10-CM | POA: Diagnosis not present

## 2022-02-23 DIAGNOSIS — I10 Essential (primary) hypertension: Secondary | ICD-10-CM | POA: Insufficient documentation

## 2022-02-23 DIAGNOSIS — Z20822 Contact with and (suspected) exposure to covid-19: Secondary | ICD-10-CM | POA: Diagnosis not present

## 2022-02-23 DIAGNOSIS — R300 Dysuria: Secondary | ICD-10-CM | POA: Diagnosis present

## 2022-02-23 DIAGNOSIS — R0981 Nasal congestion: Secondary | ICD-10-CM

## 2022-02-23 DIAGNOSIS — J029 Acute pharyngitis, unspecified: Secondary | ICD-10-CM | POA: Diagnosis not present

## 2022-02-23 DIAGNOSIS — J45909 Unspecified asthma, uncomplicated: Secondary | ICD-10-CM | POA: Diagnosis not present

## 2022-02-23 DIAGNOSIS — N3000 Acute cystitis without hematuria: Secondary | ICD-10-CM | POA: Diagnosis not present

## 2022-02-23 DIAGNOSIS — Z79899 Other long term (current) drug therapy: Secondary | ICD-10-CM | POA: Diagnosis not present

## 2022-02-23 DIAGNOSIS — R051 Acute cough: Secondary | ICD-10-CM | POA: Insufficient documentation

## 2022-02-23 DIAGNOSIS — H109 Unspecified conjunctivitis: Secondary | ICD-10-CM

## 2022-02-23 DIAGNOSIS — Z7951 Long term (current) use of inhaled steroids: Secondary | ICD-10-CM | POA: Diagnosis not present

## 2022-02-23 LAB — URINALYSIS, ROUTINE W REFLEX MICROSCOPIC
Bilirubin Urine: NEGATIVE
Glucose, UA: NEGATIVE mg/dL
Hgb urine dipstick: NEGATIVE
Ketones, ur: NEGATIVE mg/dL
Nitrite: NEGATIVE
Protein, ur: 100 mg/dL — AB
Specific Gravity, Urine: 1.019 (ref 1.005–1.030)
WBC, UA: >50 WBC/hpf — ABNORMAL HIGH (ref 0–5)
pH: 6 (ref 5.0–8.0)

## 2022-02-23 LAB — RESP PANEL BY RT-PCR (FLU A&B, COVID) ARPGX2
Influenza A by PCR: NEGATIVE
Influenza B by PCR: NEGATIVE
SARS Coronavirus 2 by RT PCR: NEGATIVE

## 2022-02-23 LAB — GROUP A STREP BY PCR: Group A Strep by PCR: NOT DETECTED

## 2022-02-23 LAB — PREGNANCY, URINE: Preg Test, Ur: NEGATIVE

## 2022-02-23 MED ORDER — BENZONATATE 100 MG PO CAPS: 100.0000 mg | ORAL_CAPSULE | Freq: Three times a day (TID) | ORAL | 0 refills | Status: DC | PRN

## 2022-02-23 MED ORDER — CEPHALEXIN 500 MG PO CAPS: 500.0000 mg | ORAL_CAPSULE | Freq: Two times a day (BID) | ORAL | 0 refills | Status: AC

## 2022-02-23 MED ORDER — ERYTHROMYCIN 5 MG/GM OP OINT
TOPICAL_OINTMENT | Freq: Once | OPHTHALMIC | Status: AC
  Administered 2022-02-23: 1 via OPHTHALMIC
  Filled 2022-02-23: qty 3.5

## 2022-02-23 NOTE — ED Triage Notes (Signed)
Pt arrived via POV, c/o sinus pressure, productive cough with yellow sputum, sore throat, and swollen eyes bilateral.  ?

## 2022-02-23 NOTE — ED Provider Notes (Incomplete)
[facility]-EMER[facility] DEPT Provider Note   CSN: [id number] Arrival date & time: 02/23/22  1127     History {Add pertinent medical, surgical, social history, OB history to HPI:1} Chief Complaint  Patient presents with   Facial Pain   Sore Throat    [name] is a 51 y.o. female past medical history of hypertension, asthma, bronchitis.  Presents to the emergency department with complaints of cough, eye discharge, nasal congestion, sore throat, and dysuria.  Patient reports that she has been dealing with a cough for the last 2 weeks.  Cough is producing clear to yellow mucus.  Patient states that she has had no improvement in her cough when taking Mucinex.  Patient reports that last week she developed injection to bilateral eyes.  Patient states that she has been having purulent discharge from bilateral eyes.  States that last week discharge was yellow in color.  Discharge has decreased and now is beige to clear.  Patient reports that her eyes are crusted shut when waking in the morning.  Patient endorses eye pruritus.  Denies any eye pain, visual disturbance, eye injury.  Patient reports that last week she had a sore throat and noticed swelling to lymph nodes on the left side of her neck.  Patient reports that swelling has improved as well as sore throat.  Patient denies any trismus, drooling, trouble swallowing, hot potato voice, shortness of breath, neck pain, neck stiffness.  Patient reports that she has been dealing with nasal congestion on and off for multiple weeks.  Patient endorses maxillary sinus pressure bilaterally.  Patient states that she has had dysuria and urinary frequency over the last 3 to 4 days.  Patient reports she is concern for urinary tract infection.  Patient denies any hematuria, vaginal pain, vaginal bleeding, vaginal discharge, abdominal pain, nausea, vomiting.  She does endorse that she had diarrhea over the last few days.  LMP  11/4-11/12.  Patient is sexually active in a mutually monogamous relationship with a female partner.  Patient is noncompliant with her hypertension medication.   Sore Throat Pertinent negatives include no chest pain, no abdominal pain, no headaches and no shortness of breath.      Home Medications Prior to Admission medications   Medication Sig Start Date End Date Taking? Authorizing Provider  amoxicillin-clavulanate (AUGMENTIN) 875-125 MG tablet Take 1 tablet by mouth 2 (two) times daily. 02/21/19   Domenick GongMortenson, Ashley, MD  calcium carbonate (TUMS - DOSED IN MG ELEMENTAL CALCIUM) 500 MG chewable tablet Chew 1 tablet by mouth 2 (two) times daily as needed for indigestion or heartburn.     [provider]  cetirizine (ZYRTEC) 10 MG tablet Take 1 tablet (10 mg total) by mouth daily. 02/21/19   Domenick GongMortenson, Ashley, MD  fluticasone (FLONASE) 50 MCG/ACT nasal spray Place 2 sprays into both nostrils daily. 02/21/19   Domenick GongMortenson, Ashley, MD  hydrochlorothiazide (HYDRODIURIL) 25 MG tablet Take 1 tablet (25 mg total) by mouth daily for 30 days. Patient not taking: Reported on 02/21/2019 01/10/19 02/09/19  Long, Arlyss RepressJoshua G, MD  HYDROcodone-acetaminophen (NORCO/VICODIN) 5-325 MG tablet Take 1-2 tablets by mouth every 4 (four) hours as needed for moderate pain. 02/21/19   Domenick GongMortenson, Ashley, MD  ibuprofen (ADVIL,MOTRIN) 600 MG tablet Take 1 tablet (600 mg total) by mouth every 6 (six) hours as needed. 02/21/19   Domenick GongMortenson, Ashley, MD      Allergies    Patient has no known allergies.    Review of Systems  Review of Systems  Constitutional:  Positive for chills and fever (subjective).  HENT:  Positive for congestion, sinus pressure and sore throat. Negative for drooling, rhinorrhea, trouble swallowing and voice change.   Eyes:  Positive for discharge, redness and itching. Negative for photophobia, pain and visual disturbance.  Respiratory:  Positive for cough. Negative for shortness of breath.    Cardiovascular:  Negative for chest pain.  Gastrointestinal:  Positive for diarrhea. Negative for abdominal pain, nausea and vomiting.  Genitourinary:  Positive for dysuria and frequency. Negative for difficulty urinating, flank pain, genital sores, hematuria, urgency, vaginal bleeding, vaginal discharge and vaginal pain.  Musculoskeletal:  Negative for back pain and neck pain.  Skin:  Negative for color change and rash.  Neurological:  Negative for dizziness, syncope, light-headedness and headaches.  Psychiatric/Behavioral:  Negative for confusion.    Physical Exam Updated Vital Signs BP (!) 171/103 (BP Location: Right Arm)    Pulse 73    Temp 98.3 F (36.8 C) (Oral)    Resp 18    SpO2 97%  Physical Exam Vitals and nursing note reviewed.  Constitutional:      General: She is not in acute distress.    Appearance: She is ill-appearing. She is not toxic-appearing or diaphoretic.  HENT:     Head: Normocephalic. No right periorbital erythema or left periorbital erythema.     Jaw: No trismus or pain on movement.     Nose:     Right Sinus: Maxillary sinus tenderness present. No frontal sinus tenderness.     Left Sinus: Maxillary sinus tenderness present. No frontal sinus tenderness.     Mouth/Throat:     Lips: Pink. No lesions.     Mouth: Mucous membranes are moist. No oral lesions or angioedema.     Tongue: No lesions. Tongue does not deviate from midline.     Palate: No mass and lesions.     Pharynx: Oropharynx is clear. Uvula midline. No pharyngeal swelling, oropharyngeal exudate, posterior oropharyngeal erythema or uvula swelling.     Tonsils: No tonsillar exudate or tonsillar abscesses. 2+ on the right. 2+ on the left.     Comments: Handles oral secretions without difficulty. Eyes:     General: No scleral icterus.       Right eye: No discharge.        Left eye: No discharge.     Extraocular Movements: Extraocular movements intact.     Conjunctiva/sclera: Conjunctivae normal.      Pupils: Pupils are equal, round, and reactive to light.  Neck:     Comments: No swelling to submandibular space. Cardiovascular:     Rate and Rhythm: Normal rate.  Pulmonary:     Effort: Pulmonary effort is normal. No tachypnea, bradypnea or respiratory distress.     Breath sounds: Normal breath sounds. No stridor.  Abdominal:     General: Abdomen is flat. There is no distension. There are no signs of injury.     Palpations: Abdomen is soft. There is no mass or pulsatile mass.     Tenderness: There is no abdominal tenderness.  Musculoskeletal:     Cervical back: Normal range of motion and neck supple. No edema, erythema, signs of trauma, rigidity, torticollis or crepitus. No pain with movement, spinous process tenderness or muscular tenderness. Normal range of motion.  Lymphadenopathy:     Cervical: No cervical adenopathy.  Skin:    General: Skin is warm and dry.  Neurological:     General: No focal deficit  present.     Mental Status: She is alert.  Psychiatric:        Behavior: Behavior is cooperative.    ED Results / Procedures / Treatments   Labs (all labs ordered are listed, but only abnormal results are displayed) Labs Reviewed - No data to display  EKG None  Radiology No results found.  Procedures Procedures  {Document cardiac monitor, telemetry assessment procedure when appropriate:1}  Medications Ordered in ED Medications - No data to display  ED Course/ Medical Decision Making/ A&P                           Medical Decision Making Amount and/or Complexity of Data Reviewed Labs: ordered. Radiology: ordered.   Alert 51 year old female in no acute distress, nontoxic-appearing.  Presents emergency department with chief complaints of cough, eye discharge, nasal congestion, sore throat, dysuria.  Information is obtained from patient.  Past medical records reviewed including previous provider notes and labs.  Patient has past medical history as outlined in HPI  which complicates her care.  Due to patient's reports of dysuria concern for possible urinary tract infection.  Will obtain urine pregnancy test as well as urinalysis and urine culture.  Patient was offered testing for STI however declines at this time.  Patient was given information that she can follow-up with the health department if she desires STI testing in the future.  Patient complains of eye discharge, eye redness and pruritus.  Patient has no injection or discharge noted at this time.  States that she is having purulent discharge throughout the day to bilateral eyes.  With reports of purulent discharge concern for possible bacterial conjunctivitis.  We will start patient on treatment for bacterial conjunctivitis.  Low suspicion for corneal abrasion as patient has no pain or discomfort as well as denies any injury.  Patient reports productive cough x2 weeks.  Lungs are clear to auscultation bilaterally however due to prolonged nature of cough will obtain chest x-ray to evaluate for possible pneumonia.  Patient has been dealing with nasal congestion and sinus pressure intermittently over the last few weeks.  Patient does have tenderness to bilateral maxillary sinuses on exam.  With symptoms not consistent for 10 days and no double sickening we will hold any antibiotic treatment at this time.  Patient noted to be hypertensive with blood pressure 171/103.  She is noncompliant with medication.  Previously patient was prescribed hydrochlorothiazide however states that she never started this medication.  Due to patient not being on any blood pressure medication for prolonged period time we will have her follow-up with PCP in outpatient setting for hypertension management.  ***    {Document critical care time when appropriate:1} {Document review of labs and clinical decision tools ie heart score, Chads2Vasc2 etc:1}  {Document your independent review of radiology images, and any outside  records:1} {Document your discussion with family members, caretakers, and with consultants:1} {Document social determinants of health affecting pt's care:1} {Document your decision making why or why not admission, treatments were needed:1} Final Clinical Impression(s) / ED Diagnoses Final diagnoses:  None    Rx / DC Orders ED Discharge Orders     None

## 2022-02-23 NOTE — Discharge Instructions (Addendum)
You came to the emergency department today to be evaluated for your flulike illness as well as burning sensation with urination. ? ?You were negative for COVID-19, influenza, and strep throat.  Your chest x-ray showed no signs of pneumonia.  Your urine sample did show signs concerning for a urinary tract infection.  Due to this she was started on the antibiotic Keflex.  Your physical exam was concerning for possible bacterial conjunctivitis.  Due to this she was started on eye ointment.  Please apply 0.5 inch of ointment 4 times a day for the next 7 days. ? ?You may have diarrhea from the antibiotics.  It is very important that you continue to take the antibiotics even if you get diarrhea unless a medical professional tells you that you may stop taking them.  If you stop too early the bacteria you are being treated for will become stronger and you may need different, more powerful antibiotics that have more side effects and worsening diarrhea.  Please stay well hydrated and consider probiotics as they may decrease the severity of your diarrhea.  Please be aware that if you take any hormonal contraception (birth control pills, nexplanon, the ring, etc) that your birth control will not work while you are taking antibiotics and you need to use back up protection as directed on the birth control medication information insert.  ? ?Please return to the emergency department if you develop any new or concerning symptoms. ?

## 2022-02-25 LAB — URINE CULTURE: Culture: 70000 — AB

## 2022-02-26 ENCOUNTER — Telehealth: Payer: Self-pay

## 2022-02-26 NOTE — Progress Notes (Signed)
ED Antimicrobial Stewardship Positive Culture Follow Up  ? ?[name] is an 51 y.o. female who presented to [facility] on 02/23/2022 with a chief complaint of  ?Chief Complaint  ?Patient presents with  ? Facial Pain  ? Sore Throat  ? ? ?Recent Results (from the past 720 hour(s))  ?Urine Culture     Status: Abnormal  ? Collection Time: 02/23/22 12:27 PM  ? Specimen: Urine, Clean Catch  ?Result Value Ref Range Status  ? Specimen Description   Final  ?  URINE, CLEAN CATCH ?Performed at [facility], [place] [address] [place], [place] [address] ?  ? Special Requests   Final  ?  NONE ?Performed at [facility], [place] [address], [place], [place] [address] ?  ? Culture 70,000 COLONIES/mL ACINETOBACTER BAUMANNII (A)  Final  ? Report Status 02/25/2022 FINAL  Final  ? Organism ID, Bacteria ACINETOBACTER BAUMANNII (A)  Final  ?    Susceptibility  ? Acinetobacter baumannii - MIC*  ?  CEFTAZIDIME <=1 SENSITIVE Sensitive   ?  CIPROFLOXACIN <=0.25 SENSITIVE Sensitive   ?  GENTAMICIN <=1 SENSITIVE Sensitive   ?  IMIPENEM <=0.25 SENSITIVE Sensitive   ?  PIP/TAZO <=4 SENSITIVE Sensitive   ?  TRIMETH/SULFA <=20 SENSITIVE Sensitive   ?  AMPICILLIN/SULBACTAM <=2 SENSITIVE Sensitive   ?  * 70,000 COLONIES/mL ACINETOBACTER BAUMANNII  ?Resp Panel by RT-PCR (Flu A&B, Covid) Nasopharyngeal Swab     Status: None  ? Collection Time: 02/23/22 12:35 PM  ? Specimen: Nasopharyngeal Swab; Nasopharyngeal(NP) swabs in vial transport medium  ?Result Value Ref Range Status  ? SARS Coronavirus 2 by RT PCR NEGATIVE NEGATIVE Final  ?  Comment: (NOTE) ?SARS-CoV-2 target nucleic acids are NOT DETECTED. ? ?The SARS-CoV-2 RNA is generally detectable in upper respiratory ?specimens during the acute phase of infection. The lowest ?concentration of SARS-CoV-2 viral copies this assay can detect is ?138 copies/mL. A negative result does not preclude SARS-Cov-2 ?infection and should not be used as the sole basis for  treatment or ?other patient management decisions. A negative result may occur with  ?improper specimen collection/handling, submission of specimen other ?than nasopharyngeal swab, presence of viral mutation(s) within the ?areas targeted by this assay, and inadequate number of viral ?copies(<138 copies/mL). A negative result must be combined with ?clinical observations, patient history, and epidemiological ?information. The expected result is Negative. ? ?Fact Sheet for Patients:  ?EntrepreneurPulse.com.au ? ?Fact Sheet for Healthcare Providers:  ?IncredibleEmployment.be ? ?This test is no t yet approved or cleared by the [place] FDA and  ?has been authorized for detection and/or diagnosis of SARS-CoV-2 by ?FDA under an Emergency Use Authorization (EUA). This EUA will remain  ?in effect (meaning this test can be used) for the duration of the ?COVID-19 declaration under Section 564(b)(1) of the Act, 21 ?U.S.C.section 360bbb-3(b)(1), unless the authorization is terminated  ?or revoked sooner.  ? ? ?  ? Influenza A by PCR NEGATIVE NEGATIVE Final  ? Influenza B by PCR NEGATIVE NEGATIVE Final  ?  Comment: (NOTE) ?The Xpert Xpress SARS-CoV-2/FLU/RSV plus assay is intended as an aid ?in the diagnosis of influenza from Nasopharyngeal swab specimens and ?should not be used as a sole basis for treatment. Nasal washings and ?aspirates are unacceptable for Xpert Xpress SARS-CoV-2/FLU/RSV ?testing. ? ?Fact Sheet for Patients: ?EntrepreneurPulse.com.au ? ?Fact Sheet for Healthcare Providers: ?IncredibleEmployment.be ? ?This test is not yet approved or cleared by the [place] and ?has been authorized for detection  and/or diagnosis of SARS-CoV-2 by ?FDA under an Emergency Use Authorization (EUA). This EUA will remain ?in effect (meaning this test can be used) for the duration of the ?COVID-19 declaration under Section 564(b)(1) of the Act, 21  U.S.C. ?section 360bbb-3(b)(1), unless the authorization is terminated or ?revoked. ? ?Performed at [facility], [place] [address] ?[place], [place] [address] ?  ?Group A Strep by PCR     Status: None  ? Collection Time: 02/23/22 12:35 PM  ? Specimen: Nasopharyngeal Swab; Sterile Swab  ?Result Value Ref Range Status  ? Group A Strep by PCR NOT DETECTED NOT DETECTED Final  ?  Comment: Performed at [facility], [place] [address], [place], [place] [address]  ? ? ?[x]  Treated with Keflex, organism resistant to prescribed antimicrobial ? ? ?New antibiotic prescription: Augmentin 500 mg po twice daily for 7 days ? ?ED Provider: Nanda Quinton, MD ? ? ?Lawson Radar ?02/26/2022, 9:46 AM ?Clinical Pharmacist ?Monday - Friday phone -  [phone] ?Saturday - Sunday phone - [phone]  ?

## 2022-02-26 NOTE — Telephone Encounter (Signed)
Post ED Visit - Positive Culture Follow-up: Successful Patient Follow-Up ? ?Culture assessed and recommendations reviewed by: ? ?[]  , Pharm.D. ?[]  Enzo Bi, .D., BCPS AQ-ID ?[]  Celedonio Miyamoto, Pharm.D., BCPS ?[x]  [address], Pharm.D., BCPS ?[]  [place], Garvin Fila.D., BCPS, AAHIVP ?[]  , Pharm.D., BCPS, AAHIVP ?[]  Georgina Pillion, PharmD, BCPS ?[]  , PharmD, BCPS ?[]  [place], PharmD, BCPS ?[]  [address], PharmD ? ?Positive urine culture ? ?[]  Patient discharged without antimicrobial prescription and treatment is now indicated ?[x]  Organism is resistant to prescribed ED discharge antimicrobial ?[]  Patient with positive blood cultures ? ?Changes discussed with ED provider: , MD ?New antibiotic prescription Augmentin 500 mg po BID x 7 days  ?Called to [facility] at [address] of [address] [phone] ? ?Contacted patient, date 02/26/2022, time 10:46 am ? ? ? ?02/26/2022, 10:45 AM ? ?  ?

## 2022-06-04 ENCOUNTER — Emergency Department (HOSPITAL_COMMUNITY): Payer: 59

## 2022-06-04 ENCOUNTER — Emergency Department (HOSPITAL_COMMUNITY)
Admission: EM | Admit: 2022-06-04 | Discharge: 2022-06-05 | Disposition: A | Discharge status: Home / Self Care | Payer: 59 | Attending: Emergency Medicine | Admitting: Emergency Medicine

## 2022-06-04 ENCOUNTER — Encounter (HOSPITAL_COMMUNITY): Payer: Self-pay | Admitting: Emergency Medicine

## 2022-06-04 DIAGNOSIS — Z85118 Personal history of other malignant neoplasm of bronchus and lung: Secondary | ICD-10-CM | POA: Insufficient documentation

## 2022-06-04 DIAGNOSIS — R0789 Other chest pain: Secondary | ICD-10-CM | POA: Insufficient documentation

## 2022-06-04 DIAGNOSIS — R051 Acute cough: Secondary | ICD-10-CM | POA: Diagnosis not present

## 2022-06-04 DIAGNOSIS — Z20822 Contact with and (suspected) exposure to covid-19: Secondary | ICD-10-CM | POA: Diagnosis not present

## 2022-06-04 DIAGNOSIS — M7918 Myalgia, other site: Secondary | ICD-10-CM | POA: Insufficient documentation

## 2022-06-04 DIAGNOSIS — J45909 Unspecified asthma, uncomplicated: Secondary | ICD-10-CM | POA: Insufficient documentation

## 2022-06-04 LAB — BASIC METABOLIC PANEL
Anion gap: 13 (ref 5–15)
BUN: 6 mg/dL (ref 6–20)
CO2: 22 mmol/L (ref 22–32)
Calcium: 9.4 mg/dL (ref 8.9–10.3)
Chloride: 101 mmol/L (ref 98–111)
Creatinine, Ser: 0.84 mg/dL (ref 0.44–1.00)
GFR, Estimated: >60 mL/min (ref >60)
Glucose, Bld: 100 mg/dL — ABNORMAL HIGH (ref 70–99)
Potassium: 3.7 mmol/L (ref 3.5–5.1)
Sodium: 136 mmol/L (ref 135–145)

## 2022-06-04 LAB — CBC
HCT: 29.5 % — ABNORMAL LOW (ref 36.0–46.0)
Hemoglobin: 9.3 g/dL — ABNORMAL LOW (ref 12.0–15.0)
MCH: 25.5 pg — ABNORMAL LOW (ref 26.0–34.0)
MCHC: 31.5 g/dL (ref 30.0–36.0)
MCV: 81 fL (ref 80.0–100.0)
Platelets: 316 10*3/uL (ref 150–400)
RBC: 3.64 MIL/uL — ABNORMAL LOW (ref 3.87–5.11)
RDW: 17 % — ABNORMAL HIGH (ref 11.5–15.5)
WBC: 6 10*3/uL (ref 4.0–10.5)
nRBC: 0 % (ref 0.0–0.2)

## 2022-06-04 LAB — TROPONIN I (HIGH SENSITIVITY)
Troponin I (High Sensitivity): 6 ng/L (ref <18)
Troponin I (High Sensitivity): 6 ng/L (ref <18)

## 2022-06-04 LAB — I-STAT BETA HCG BLOOD, ED (MC, WL, AP ONLY): I-stat hCG, quantitative: <5 m[IU]/mL (ref <5)

## 2022-06-04 MED ORDER — IBUPROFEN 400 MG PO TABS
400.0000 mg | ORAL_TABLET | Freq: Once | ORAL | Status: AC
  Administered 2022-06-04: 400 mg via ORAL
  Filled 2022-06-04: qty 1

## 2022-06-04 MED ORDER — ACETAMINOPHEN 500 MG PO TABS
1000.0000 mg | ORAL_TABLET | ORAL | Status: AC
  Administered 2022-06-04: 1000 mg via ORAL
  Filled 2022-06-04: qty 2

## 2022-06-04 MED ORDER — ALBUTEROL SULFATE HFA 108 (90 BASE) MCG/ACT IN AERS
2.0000 | INHALATION_SPRAY | Freq: Once | RESPIRATORY_TRACT | Status: AC
  Administered 2022-06-04: 2 via RESPIRATORY_TRACT
  Filled 2022-06-04: qty 6.7

## 2022-06-04 MED ORDER — HYDROCOD POLI-CHLORPHE POLI ER 10-8 MG/5ML PO SUER
5.0000 mL | Freq: Once | ORAL | Status: AC
  Administered 2022-06-04: 5 mL via ORAL
  Filled 2022-06-04: qty 5

## 2022-06-04 NOTE — ED Triage Notes (Signed)
Patient BIB [facility] from a friend's home for evaluation of anterior chest pain that started yesterday morning. Patient has history of anxiety. Patient received 324mg  ASA from EMS PTA. Patient is alert, oriented, and in no apparent distress at this time.
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
## 2022-06-04 NOTE — ED Notes (Signed)
Pt ambulated in hallway for pulse ox check. Pt able to ambulate with steady gait and O2 saturations were maintained between 95-100%. Will notify provider of the same.

## 2022-06-05 LAB — RESP PANEL BY RT-PCR (FLU A&B, COVID) ARPGX2
Influenza A by PCR: NEGATIVE
Influenza B by PCR: NEGATIVE
SARS Coronavirus 2 by RT PCR: NEGATIVE

## 2022-06-05 NOTE — Discharge Instructions (Addendum)
Please follow-up with your primary care doctor. Can use over the counter cough medication as needed such as robitussin, delsym, mucinex, etc. You will be notified if covid/flu test is positive.

## 2022-06-22 ENCOUNTER — Encounter (HOSPITAL_COMMUNITY): Payer: Self-pay | Admitting: Student in an Organized Health Care Education/Training Program

## 2022-06-22 ENCOUNTER — Ambulatory Visit (INDEPENDENT_AMBULATORY_CARE_PROVIDER_SITE_OTHER): Payer: 59 | Admitting: Student in an Organized Health Care Education/Training Program

## 2022-06-22 ENCOUNTER — Encounter (HOSPITAL_COMMUNITY): Payer: Self-pay

## 2022-06-22 VITALS — BP 162/95 | HR 79 | Wt 173.8 lb

## 2022-06-22 DIAGNOSIS — F331 Major depressive disorder, recurrent, moderate: Secondary | ICD-10-CM

## 2022-06-22 DIAGNOSIS — F4310 Post-traumatic stress disorder, unspecified: Secondary | ICD-10-CM | POA: Diagnosis not present

## 2022-06-22 DIAGNOSIS — F411 Generalized anxiety disorder: Secondary | ICD-10-CM | POA: Diagnosis not present
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
## 2022-06-29 ENCOUNTER — Ambulatory Visit (INDEPENDENT_AMBULATORY_CARE_PROVIDER_SITE_OTHER): Payer: 59 | Admitting: Student in an Organized Health Care Education/Training Program

## 2022-06-29 DIAGNOSIS — F411 Generalized anxiety disorder: Secondary | ICD-10-CM

## 2022-06-29 DIAGNOSIS — F331 Major depressive disorder, recurrent, moderate: Secondary | ICD-10-CM | POA: Diagnosis not present

## 2022-06-29 DIAGNOSIS — F4310 Post-traumatic stress disorder, unspecified: Secondary | ICD-10-CM

## 2022-06-29 NOTE — Plan of Care (Signed)
  Problem: Depression CCP Problem  1  Goal: LTG- Avoid turning to Alcohol/Reduce Alcohol Outcome: Not Applicable   Problem: Depression CCP Problem  1  Goal: LTG- Avoid turning to Alcohol/Reduce Alcohol Outcome: Not Applicable Goal: STG- Re-establish with a Church Outcome: Not Applicable Goal: LTG: Ticia WILL SCORE LESS THAN 10 ON THE PATIENT HEALTH QUESTIONNAIRE (PHQ-9) Outcome: Not Applicable Goal: STG: Braelynn WILL PARTICIPATE IN AT LEAST 80% OF SCHEDULED INDIVIDUAL PSYCHOTHERAPY SESSIONS Outcome: Not Applicable

## 2022-06-29 NOTE — Progress Notes (Deleted)
Patient ID: [name], female   DOB: 15-Aug-1971, 51 y.o.   MRN: [id number]
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
## 2022-07-07 ENCOUNTER — Ambulatory Visit (HOSPITAL_COMMUNITY): Payer: 59 | Admitting: Student in an Organized Health Care Education/Training Program

## 2022-08-11 ENCOUNTER — Ambulatory Visit (HOSPITAL_COMMUNITY): Payer: 59 | Admitting: Student in an Organized Health Care Education/Training Program

## 2022-08-11 ENCOUNTER — Encounter (HOSPITAL_COMMUNITY): Payer: Self-pay

## 2022-10-13 ENCOUNTER — Ambulatory Visit (INDEPENDENT_AMBULATORY_CARE_PROVIDER_SITE_OTHER): Payer: Commercial Managed Care - HMO | Admitting: Student in an Organized Health Care Education/Training Program

## 2022-10-13 DIAGNOSIS — F4310 Post-traumatic stress disorder, unspecified: Secondary | ICD-10-CM | POA: Diagnosis not present

## 2022-10-13 DIAGNOSIS — F331 Major depressive disorder, recurrent, moderate: Secondary | ICD-10-CM | POA: Diagnosis not present

## 2022-10-13 DIAGNOSIS — F411 Generalized anxiety disorder: Secondary | ICD-10-CM
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
## 2022-10-21 ENCOUNTER — Ambulatory Visit (HOSPITAL_BASED_OUTPATIENT_CLINIC_OR_DEPARTMENT_OTHER): Payer: Commercial Managed Care - HMO | Admitting: Student in an Organized Health Care Education/Training Program

## 2022-10-21 DIAGNOSIS — F4310 Post-traumatic stress disorder, unspecified: Secondary | ICD-10-CM | POA: Diagnosis not present

## 2022-10-21 DIAGNOSIS — F411 Generalized anxiety disorder: Secondary | ICD-10-CM | POA: Diagnosis not present

## 2022-10-21 DIAGNOSIS — F331 Major depressive disorder, recurrent, moderate: Secondary | ICD-10-CM | POA: Diagnosis not present
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
## 2022-11-18 ENCOUNTER — Encounter (HOSPITAL_COMMUNITY): Payer: Self-pay | Admitting: Student in an Organized Health Care Education/Training Program

## 2022-11-18 ENCOUNTER — Ambulatory Visit (HOSPITAL_BASED_OUTPATIENT_CLINIC_OR_DEPARTMENT_OTHER): Payer: Commercial Managed Care - HMO | Admitting: Student in an Organized Health Care Education/Training Program

## 2022-11-18 DIAGNOSIS — F4310 Post-traumatic stress disorder, unspecified: Secondary | ICD-10-CM | POA: Diagnosis not present

## 2022-11-18 DIAGNOSIS — F331 Major depressive disorder, recurrent, moderate: Secondary | ICD-10-CM | POA: Diagnosis not present

## 2022-11-18 DIAGNOSIS — F411 Generalized anxiety disorder: Secondary | ICD-10-CM
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
## 2022-11-25 ENCOUNTER — Ambulatory Visit (HOSPITAL_COMMUNITY): Payer: Commercial Managed Care - HMO | Admitting: Student in an Organized Health Care Education/Training Program

## 2022-12-09 ENCOUNTER — Ambulatory Visit (HOSPITAL_COMMUNITY): Payer: Commercial Managed Care - HMO | Admitting: Student in an Organized Health Care Education/Training Program

## 2022-12-30 ENCOUNTER — Ambulatory Visit (HOSPITAL_COMMUNITY): Payer: Commercial Managed Care - HMO | Admitting: Student in an Organized Health Care Education/Training Program

## 2023-03-29 ENCOUNTER — Emergency Department (HOSPITAL_COMMUNITY): Payer: Self-pay

## 2023-03-29 ENCOUNTER — Encounter (HOSPITAL_COMMUNITY): Payer: Self-pay

## 2023-03-29 ENCOUNTER — Emergency Department (HOSPITAL_COMMUNITY)
Admission: EM | Admit: 2023-03-29 | Discharge: 2023-03-30 | Disposition: A | Discharge status: Home / Self Care | Payer: Self-pay | Attending: Emergency Medicine | Admitting: Emergency Medicine

## 2023-03-29 ENCOUNTER — Other Ambulatory Visit: Payer: Self-pay

## 2023-03-29 DIAGNOSIS — I10 Essential (primary) hypertension: Secondary | ICD-10-CM | POA: Insufficient documentation

## 2023-03-29 DIAGNOSIS — J45909 Unspecified asthma, uncomplicated: Secondary | ICD-10-CM | POA: Insufficient documentation

## 2023-03-29 DIAGNOSIS — R569 Unspecified convulsions: Secondary | ICD-10-CM | POA: Insufficient documentation

## 2023-03-29 LAB — CBC WITH DIFFERENTIAL/PLATELET
Abs Immature Granulocytes: 0.04 10*3/uL (ref 0.00–0.07)
Basophils Absolute: 0 10*3/uL (ref 0.0–0.1)
Basophils Relative: 1 %
Eosinophils Absolute: 0 10*3/uL (ref 0.0–0.5)
Eosinophils Relative: 1 %
HCT: 25.9 % — ABNORMAL LOW (ref 36.0–46.0)
Hemoglobin: 7.1 g/dL — ABNORMAL LOW (ref 12.0–15.0)
Immature Granulocytes: 1 %
Lymphocytes Relative: 42 %
Lymphs Abs: 1.7 10*3/uL (ref 0.7–4.0)
MCH: 20 pg — ABNORMAL LOW (ref 26.0–34.0)
MCHC: 27.4 g/dL — ABNORMAL LOW (ref 30.0–36.0)
MCV: 73 fL — ABNORMAL LOW (ref 80.0–100.0)
Monocytes Absolute: 0.5 10*3/uL (ref 0.1–1.0)
Monocytes Relative: 12 %
Neutro Abs: 1.8 10*3/uL (ref 1.7–7.7)
Neutrophils Relative %: 43 %
Platelets: 233 10*3/uL (ref 150–400)
RBC: 3.55 MIL/uL — ABNORMAL LOW (ref 3.87–5.11)
RDW: 19.8 % — ABNORMAL HIGH (ref 11.5–15.5)
WBC: 4.1 10*3/uL (ref 4.0–10.5)
nRBC: 1 % — ABNORMAL HIGH (ref 0.0–0.2)

## 2023-03-29 LAB — COMPREHENSIVE METABOLIC PANEL
ALT: 19 U/L (ref 0–44)
AST: 26 U/L (ref 15–41)
Albumin: 4 g/dL (ref 3.5–5.0)
Alkaline Phosphatase: 52 U/L (ref 38–126)
Anion gap: 10 (ref 5–15)
BUN: 10 mg/dL (ref 6–20)
CO2: 23 mmol/L (ref 22–32)
Calcium: 8.3 mg/dL — ABNORMAL LOW (ref 8.9–10.3)
Chloride: 107 mmol/L (ref 98–111)
Creatinine, Ser: 0.83 mg/dL (ref 0.44–1.00)
GFR, Estimated: >60 mL/min (ref >60)
Glucose, Bld: 92 mg/dL (ref 70–99)
Potassium: 3.5 mmol/L (ref 3.5–5.1)
Sodium: 140 mmol/L (ref 135–145)
Total Bilirubin: 0.4 mg/dL (ref 0.3–1.2)
Total Protein: 8.3 g/dL — ABNORMAL HIGH (ref 6.5–8.1)

## 2023-03-29 LAB — ETHANOL: Alcohol, Ethyl (B): 227 mg/dL — ABNORMAL HIGH (ref <10)

## 2023-03-29 MED ORDER — ACETAMINOPHEN 500 MG PO TABS
1000.0000 mg | ORAL_TABLET | Freq: Once | ORAL | Status: AC
  Administered 2023-03-29: 1000 mg via ORAL
  Filled 2023-03-29: qty 2

## 2023-03-29 NOTE — ED Provider Notes (Signed)
[place] EMERGENCY DEPARTMENT AT [facility] Provider Note   CSN: [id number] Arrival date & time: 03/29/23  2222     History {Add pertinent medical, surgical, social history, OB history to HPI:1} Chief Complaint  Patient presents with   Seizures    [name] is a 52 y.o. female.  The history is provided by the patient and medical records.  Seizures  52 year old female with history of anemia, asthma, hypertension, history of intermittent seizures, presenting to the ED after witnessed seizure at home.  Patient reports history of seizures, however has never been on antiepileptics.  States after her son was killed 3 years ago episodes seem to be increasing in frequency.  Tonight she was at friend's house and fell from standing position onto the floor and had reported witnessed seizure-like activity.  No incontinence.  Friend is not at the bedside for collaborative history.  Patient does report feeling "odd" before this began but does not recall the seizure itself.  She was sitting commode upon EMS arrival, was not post-ictal with them.  She does admit to having 4 beers tonight.  Denies any drug use.  She states her head is hurting quite bad at this time.  Home Medications Prior to Admission medications   Medication Sig Start Date End Date Taking? Authorizing Provider  benzonatate (TESSALON) 100 MG capsule Take 1 capsule (100 mg total) by mouth every 8 (eight) hours as needed for cough. Patient not taking: Reported on 06/04/2022 02/23/22   Haskel Schroeder, PA-C  cetirizine (ZYRTEC) 10 MG tablet Take 1 tablet (10 mg total) by mouth daily. Patient not taking: Reported on 06/04/2022 02/21/19   Domenick Gong, MD  fluticasone [facility]) 50 MCG/ACT nasal spray Place 2 sprays into both nostrils daily. Patient not taking: Reported on 06/04/2022 02/21/19   Domenick Gong, MD  hydrochlorothiazide (HYDRODIURIL) 25 MG tablet Take 1 tablet (25 mg total) by mouth daily for 30  days. Patient not taking: Reported on 02/21/2019 01/10/19 02/09/19  Long, Arlyss Repress, MD  HYDROcodone-acetaminophen (NORCO/VICODIN) 5-325 MG tablet Take 1-2 tablets by mouth every 4 (four) hours as needed for moderate pain. Patient not taking: Reported on 06/04/2022 02/21/19   Domenick Gong, MD  ibuprofen (ADVIL,MOTRIN) 600 MG tablet Take 1 tablet (600 mg total) by mouth every 6 (six) hours as needed. Patient not taking: Reported on 06/04/2022 02/21/19   Domenick Gong, MD      Allergies    Patient has no known allergies.    Review of Systems   Review of Systems  Neurological:  Positive for seizures.  All other systems reviewed and are negative.   Physical Exam Updated Vital Signs BP 120/78   Pulse 79   Temp 97.9 F (36.6 C)   Resp 12   Ht  (1.6 m)   Wt 81.6 kg   LMP 03/09/2023   SpO2 97%   BMI 31.89 kg/m   Physical Exam Vitals and nursing note reviewed.  Constitutional:      Appearance: She is well-developed.  HENT:     Head: Normocephalic and atraumatic.     Mouth/Throat:     Comments: No tongue or dental injury noted Eyes:     Conjunctiva/sclera: Conjunctivae normal.     Pupils: Pupils are equal, round, and reactive to light.  Cardiovascular:     Rate and Rhythm: Normal rate and regular rhythm.     Heart sounds: Normal heart sounds.  Pulmonary:     Effort: Pulmonary effort is normal.  Breath sounds: Normal breath sounds.  Abdominal:     General: Bowel sounds are normal.     Palpations: Abdomen is soft.  Musculoskeletal:        General: Normal range of motion.     Cervical back: Normal range of motion.  Skin:    General: Skin is warm and dry.  Neurological:     Mental Status: She is alert and oriented to person, place, and time.     Comments: AAOx3, moving extremities well, no focal deficits  Psychiatric:     Comments: tearful     ED Results / Procedures / Treatments   Labs (all labs ordered are listed, but only abnormal results are  displayed) Labs Reviewed  CBC WITH DIFFERENTIAL/PLATELET  COMPREHENSIVE METABOLIC PANEL  ETHANOL  RAPID URINE DRUG SCREEN, HOSP PERFORMED    EKG None  Radiology No results found.  Procedures Procedures  {Document cardiac monitor, telemetry assessment procedure when appropriate:1}  Medications Ordered in ED Medications  acetaminophen (TYLENOL) tablet 1,000 mg (has no administration in time range)    ED Course/ Medical Decision Making/ A&P   {   Click here for ABCD2, HEART and other calculatorsREFRESH Note before signing :1}                          Medical Decision Making Amount and/or Complexity of Data Reviewed Labs: ordered. Radiology: ordered.  Risk OTC drugs.    Final Clinical Impression(s) / ED Diagnoses Final diagnoses:  None    Rx / DC Orders ED Discharge Orders     None

## 2023-03-29 NOTE — ED Triage Notes (Signed)
BIBA from home after having seizure. Hx of seizures - last seizure about 2 weeks ago. Friend at house witnessed seizure like activity prior to calling 911. Pt fell on floor from standing position during this episode. Pt was on the toilet upon EMS arrival. Pt is amnesic to event. ETOH+ (states 4 beers). Pt is tearful upon arrival and c/o headache and lightheadedness. EMS reports that pt became dizzy upon standing from toilet.  States that she has never been on anti seizure meds in the past, but also does not see a doctor regularly.  EMS vitals: 90/60 > NS > 130/76, HR 80, SpO2 98% CBG 138

## 2023-03-30 NOTE — Discharge Instructions (Signed)
I have sent referral for neurology follow-up, they should be contacting you within the next 48 hours.  If you do not hear from them soon please call the office to follow-up on this. No driving until cleared by neurology-- this is [place] law.  Make sure to eat and drink regularly, get adequate sleep. Return here for any new or acute changes.

## 2023-07-06 ENCOUNTER — Telehealth (HOSPITAL_COMMUNITY): Payer: Self-pay | Admitting: Student in an Organized Health Care Education/Training Program

## 2023-07-20 ENCOUNTER — Ambulatory Visit: Payer: Medicaid Other | Admitting: Podiatry

## 2023-07-29 ENCOUNTER — Ambulatory Visit: Payer: Medicaid Other | Admitting: Podiatry

## 2023-07-30 ENCOUNTER — Ambulatory Visit (INDEPENDENT_AMBULATORY_CARE_PROVIDER_SITE_OTHER): Payer: 59 | Admitting: Student in an Organized Health Care Education/Training Program

## 2023-07-30 VITALS — BP 140/89 | HR 91 | Ht 63.0 in | Wt 178.2 lb

## 2023-07-30 DIAGNOSIS — F411 Generalized anxiety disorder: Secondary | ICD-10-CM | POA: Diagnosis not present

## 2023-07-30 DIAGNOSIS — F4310 Post-traumatic stress disorder, unspecified: Secondary | ICD-10-CM | POA: Diagnosis not present

## 2023-07-30 DIAGNOSIS — F331 Major depressive disorder, recurrent, moderate: Secondary | ICD-10-CM
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
## 2023-08-06 ENCOUNTER — Ambulatory Visit: Payer: Medicaid Other | Admitting: Podiatry

## 2023-09-10 ENCOUNTER — Encounter (HOSPITAL_COMMUNITY): Payer: Self-pay | Admitting: Student in an Organized Health Care Education/Training Program

## 2023-09-10 ENCOUNTER — Ambulatory Visit (INDEPENDENT_AMBULATORY_CARE_PROVIDER_SITE_OTHER): Payer: PRIVATE HEALTH INSURANCE | Admitting: Student in an Organized Health Care Education/Training Program

## 2023-09-10 DIAGNOSIS — R440 Auditory hallucinations: Secondary | ICD-10-CM | POA: Diagnosis not present

## 2023-09-10 DIAGNOSIS — F411 Generalized anxiety disorder: Secondary | ICD-10-CM

## 2023-09-10 DIAGNOSIS — F4310 Post-traumatic stress disorder, unspecified: Secondary | ICD-10-CM

## 2023-09-10 DIAGNOSIS — F331 Major depressive disorder, recurrent, moderate: Secondary | ICD-10-CM | POA: Diagnosis not present

## 2023-09-10 MED ORDER — ARIPIPRAZOLE 2 MG PO TABS: 2.0000 mg | ORAL_TABLET | Freq: Every day | ORAL | 1 refills | Status: DC

## 2023-09-10 NOTE — Progress Notes (Signed)
[facility] [address] [place] [address] Dept: [phone] Dept Fax: [phone]  Psychotherapy Progress Note  Patient ID: [name], female  DOB: [birth year], 52 y.o.  MRN: [id number]  09/10/2023 Start time: 9:00 AM End time: 9:40 AM  Method of Visit: Face-to-Face  Present: patient  Current Concerns:  [name] is a 52 yr old female who presents to establish care for therapy. PPHx is significant Depression, 1 Suicide Attempt (OD on Tylenol 1980's), and 1 hospitalization (2011).   She reports that she has been going to a church for the past few weeks.  She reports that her aunt is the pastor.  She reports this has been helpful.  She reports that she has not been drinking for about 3 weeks now.  He reports he had been drinking 1-2 beers a night.  She reports she has been feeling better since then.  She reports that she has applied for disability.  She also reports that she has applied for housing assistance.  She reports feeling like she is always having to take care of other people and never takes care of herself.  She reports she continues to hear voices occasionally.  Discussed starting Abilify to address the auditory hallucinations as well as her depression and anxiety.  Discussed potential risks and side effects and she was agreeable to the trial.  Prior to leaving the appointment she confirmed she was in a stable and safe mindset.  She reports no SI, HI, or VH.  She reports having AH.  She then had to end therapy early due to her Medicaid ride arriving.   Current Symptoms: Anxiety and Depressed Mood  Psychiatric Specialty Exam: General Appearance: Casual and Fairly Groomed  Eye Contact:  Good  Speech:  Clear and Coherent and Normal Rate  Volume:  Normal  Mood:  Anxious and Depressed  Affect:  Depressed and Tearful  Thought Process:  Coherent and Goal Directed  Orientation:  Full (Time, Place, and  Person)  Thought Content:  Logical and Hallucinations: Auditory  Suicidal Thoughts:  No  Homicidal Thoughts:  No  Memory:  Immediate;   Good Recent;   Good  Judgement:  Good  Insight:  Good  Psychomotor Activity:  Normal  Concentration:  Concentration: Good and Attention Span: Good  Recall:  Good  Fund of Knowledge:Good  Language: Good  Akathisia:  Negative  Handed:  Left  AIMS (if indicated):  not done  Assets:  Communication Skills Desire for Improvement Resilience  ADL's:  Intact  Cognition: WNL  Sleep:  Fair     Diagnosis: MDD, GAD, PTSD, Auditory Hallucinations  Anticipated Frequency of Visits: Every 3 weeks Anticipated Length of Treatment Episode: 12-16  Short Term Goals/Goals for Treatment Session: Re-establish therapeutic bond Progress Towards Goals: Initial  Treatment Intervention: Supportive therapy  Medical Necessity: Prevented onset or worsening of patient condition  Assessment Tools:    06/22/2022    3:02 PM  Depression screen PHQ 2/9  Decreased Interest 1  Down, Depressed, Hopeless 1  PHQ - 2 Score 2  Altered sleeping 1  Tired, decreased energy 1  Change in appetite 1  Feeling bad or failure about yourself  1  Trouble concentrating 1  Moving slowly or fidgety/restless 0  Suicidal thoughts 0  PHQ-9 Score 7   Failed to redirect to the Timeline version of the REVFS SmartLink. Flowsheet Row ED from 03/29/2023 in [facility] Emergency Department at [facility] ED from 06/04/2022 in  [place] [facility] at [facility] ED from 02/23/2022 in [facility] Emergency Department at [facility]  C-SSRS RISK CATEGORY No Risk No Risk No Risk       Collaboration of Care:   Patient/Guardian was advised Release of Information must be obtained prior to any record release in order to collaborate their care with an outside provider. Patient/Guardian was advised if they have not already done so to contact the registration  department to sign all necessary forms in order for [place] to release information regarding their care.   Consent: Patient/Guardian gives verbal consent for treatment and assignment of benefits for services provided during this visit. Patient/Guardian expressed understanding and agreed to proceed.   Plan: Provided talk/Supportive therapy.  She continues to have significant depression and anxiety and multiple stressors placed on her.  We will start Abilify at this time to address her depression and anxiety as well as auditory hallucinations.  Prior to leaving the appointment she confirmed she was in a stable and safe mindset.  She reports no SI, HI, or VH.  She reports having AH.   MDD, Recurrent, Severe  GAD  PTSD  Auditory Hallucinations (r/o Post Ictal Psychosis): -Start Abilify 2 mg daily for depression, anxiety, and hallucinations   Lauro Franklin, MD 09/10/2023
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
## 2023-09-20 ENCOUNTER — Encounter: Payer: Self-pay | Admitting: Neurology

## 2023-09-20 ENCOUNTER — Ambulatory Visit: Payer: Medicaid Other | Admitting: Neurology

## 2023-09-20 VITALS — BP 183/118 | HR 71 | Ht 63.0 in | Wt 178.5 lb

## 2023-09-20 DIAGNOSIS — I10 Essential (primary) hypertension: Secondary | ICD-10-CM | POA: Diagnosis not present

## 2023-09-20 DIAGNOSIS — R569 Unspecified convulsions: Secondary | ICD-10-CM

## 2023-09-20 MED ORDER — LAMOTRIGINE 25 MG PO TABS: ORAL_TABLET | ORAL | 0 refills | Status: DC

## 2023-09-20 MED ORDER — HYDROCHLOROTHIAZIDE 25 MG PO TABS: 25.0000 mg | ORAL_TABLET | Freq: Every day | ORAL | 0 refills | Status: DC

## 2023-09-20 MED ORDER — LAMOTRIGINE 100 MG PO TABS: 100.0000 mg | ORAL_TABLET | Freq: Every day | ORAL | 0 refills | Status: DC

## 2023-09-20 NOTE — Patient Instructions (Signed)
Routine EEG, contact you to go over the result Start lamotrigine 25 mg daily for 1 week then increase to 50 mg daily for 1 week then increase to 75 mg daily for 1 week then lastly increase to 100 mg daily.  Please stop the medication and contact me if you develop a rash Please start Abilify Please start hydrochlorothiazide 25 mg daily Please set up care with PCP Follow-up in 6 months or sooner if worse.
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
## 2023-10-01 ENCOUNTER — Ambulatory Visit (HOSPITAL_COMMUNITY): Payer: Medicaid Other | Admitting: Student in an Organized Health Care Education/Training Program

## 2023-10-01 ENCOUNTER — Encounter (HOSPITAL_COMMUNITY): Payer: Self-pay

## 2023-10-21 ENCOUNTER — Encounter: Payer: Self-pay | Admitting: *Deleted

## 2023-10-21 ENCOUNTER — Other Ambulatory Visit: Payer: Medicaid Other | Admitting: *Deleted

## 2023-10-22 ENCOUNTER — Ambulatory Visit: Payer: Medicaid Other | Admitting: Family Medicine

## 2023-10-22 ENCOUNTER — Encounter (HOSPITAL_COMMUNITY): Payer: Self-pay | Admitting: Student in an Organized Health Care Education/Training Program

## 2023-10-22 ENCOUNTER — Ambulatory Visit (INDEPENDENT_AMBULATORY_CARE_PROVIDER_SITE_OTHER): Payer: PRIVATE HEALTH INSURANCE | Admitting: Student in an Organized Health Care Education/Training Program

## 2023-10-22 DIAGNOSIS — F4310 Post-traumatic stress disorder, unspecified: Secondary | ICD-10-CM

## 2023-10-22 DIAGNOSIS — F331 Major depressive disorder, recurrent, moderate: Secondary | ICD-10-CM | POA: Diagnosis not present

## 2023-10-22 DIAGNOSIS — F411 Generalized anxiety disorder: Secondary | ICD-10-CM | POA: Diagnosis not present

## 2023-10-22 NOTE — Progress Notes (Signed)
[facility] [address] [place] [address] Dept: [phone] Dept Fax: [phone]  Psychotherapy Progress Note  Patient ID: [name], female  DOB: 08-13-1971, 52 y.o.  MRN: [id number]  10/22/2023 Start time: 10:30 AM End time: 11:19 AM  Method of Visit: Face-to-Face  Present: patient  Current Concerns:  [name] is a 51 yr old female who presents to establish care for therapy. PPHx is significant Depression, 1 Suicide Attempt (OD on Tylenol 1980's), and 1 hospitalization (2011).   She reports that she did not start her Abilify yet.  She reports that her aunt died and so she left for [place] for 2 weeks.  She reports that she will start the Abilify now.  She reports that going home was hard but in some ways was also nice.  She reports that the [place] in [place] 2 hours out of [place] and it is calm out there.  She reports that it is the anniversary of her son's passing this month.  She reports that she does plan to release a balloon as she has every year.  She reports that she did visit her niece who lives here in town and that that was good for her.  She reports her niece has an 7-month-old and she enjoyed seeing the baby.  Prior to leaving the appointment she confirmed she was in a stable and safe mindset.  She reports no SI, HI, or VH.  She reports occasionally hearing voices that she can only sometimes make out what they are saying.      Current Symptoms: Anxiety and Depressed Mood  Psychiatric Specialty Exam: General Appearance: Casual and Fairly Groomed  Eye Contact:  Good  Speech:  Clear and Coherent and Normal Rate  Volume:  Normal  Mood:  Anxious and Depressed  Affect:  Depressed and Tearful  Thought Process:  Coherent and Goal Directed  Orientation:  Full (Time, Place, and Person)  Thought Content:  Logical and Hallucinations: Auditory  Suicidal Thoughts:  No  Homicidal Thoughts:   No  Memory:  Immediate;   Good Recent;   Good  Judgement:  Good  Insight:  Good  Psychomotor Activity:  Normal  Concentration:  Concentration: Good and Attention Span: Good  Recall:  Good  Fund of Knowledge:Good  Language: Good  Akathisia:  Negative  Handed:  Left  AIMS (if indicated):  not done  Assets:  Communication Skills Desire for Improvement Resilience  ADL's:  Intact  Cognition: WNL  Sleep:  Fair     Diagnosis: MDD, GAD, PTSD, Auditory Hallucinations  Anticipated Frequency of Visits: Every 3 weeks Anticipated Length of Treatment Episode: 12-16  Short Term Goals/Goals for Treatment Session: Re-establish therapeutic bond Progress Towards Goals: Initial  Treatment Intervention: Supportive therapy  Medical Necessity: Prevented onset or worsening of patient condition  Assessment Tools:    06/22/2022    3:02 PM  Depression screen PHQ 2/9  Decreased Interest 1  Down, Depressed, Hopeless 1  PHQ - 2 Score 2  Altered sleeping 1  Tired, decreased energy 1  Change in appetite 1  Feeling bad or failure about yourself  1  Trouble concentrating 1  Moving slowly or fidgety/restless 0  Suicidal thoughts 0  PHQ-9 Score 7   Failed to redirect to the Timeline version of the REVFS SmartLink. Flowsheet Row ED from 03/29/2023 [facility] Department at [facility] ED from 06[facility]04/2022 in [facility] Department at [facility] ED from 02/23/2022 in [facility] Department at [facility]  C-SSRS RISK CATEGORY No Risk No Risk No Risk       Collaboration of Care:   Patient/Guardian was advised Release of Information must be obtained prior to any record release in order to collaborate their care with an outside provider. Patient/Guardian was advised if they have not already done so to contact the registration department to sign all necessary forms in order for [place] to release information regarding their care.   Consent:  Patient/Guardian gives verbal consent for treatment and assignment of benefits for services provided during this visit. Patient/Guardian expressed understanding and agreed to proceed.   Plan: Provided talk/Supportive therapy.  She has not yet started the Abilify but will today.  This month is the anniversary of the passing of her son and so she is depressed/tearful. Prior to leaving the appointment she confirmed she was in a stable and safe mindset.  She reports no SI, HI, or VH.  She reports occasionally hearing voices that she can only sometimes make out what they are saying.   MDD, Recurrent, Severe  GAD  PTSD  Auditory Hallucinations (r/o Post Ictal Psychosis): -Start Abilify 2 mg daily for depression, anxiety, and hallucinations.   Lauro Franklin, MD 10/22/2023

## 2023-11-18 ENCOUNTER — Ambulatory Visit: Payer: 59 | Admitting: Neurology

## 2023-11-18 DIAGNOSIS — R569 Unspecified convulsions: Secondary | ICD-10-CM | POA: Diagnosis not present

## 2023-11-18 NOTE — Procedures (Signed)
    History:  52 year old woman with seizure like activity   EEG classification: Awake and drowsy  Duration: 25 minutes   Technical aspects: This EEG study was done with scalp electrodes positioned according to the 10-20 International system of electrode placement. Electrical activity was reviewed with band pass filter of 1-70Hz , sensitivity of 7 uV/mm, display speed of 47mm/sec with a 60Hz  notched filter applied as appropriate. EEG data were recorded continuously and digitally stored.   Description of the recording: The background rhythms of this recording consists of a fairly well modulated medium amplitude alpha rhythm of 11 Hz that is reactive to eye opening and closure. Present in the anterior head region is a 15-20 Hz beta activity. Photic stimulation was performed, did not show any abnormalities. Hyperventilation was also performed, did not show any abnormalities. Drowsiness was manifested by background fragmentation. No abnormal epileptiform discharges seen during this recording. There was no focal slowing. There were no electrographic seizure identified.   Abnormality: None   Impression: This is a normal EEG recorded while drowsy and awake. No evidence of interictal epileptiform discharges. Normal EEGs, however, do not rule out epilepsy.    Windell Norfolk, MD [facility]

## 2023-11-19 ENCOUNTER — Ambulatory Visit (HOSPITAL_COMMUNITY): Payer: PRIVATE HEALTH INSURANCE | Admitting: Student in an Organized Health Care Education/Training Program

## 2023-11-19 NOTE — Progress Notes (Unsigned)
  [facility] [address] [place] [address] Dept: [phone] Dept Fax: [phone]  Psychotherapy Progress Note  Patient ID: SHAVANTE BIRCHARD, female  DOB: 1971-04-02, 52 y.o.  MRN: [id number]  11/19/2023 Start time: 10:30 AM End time: 11:19 AM  Method of Visit: Face-to-Face  Present: patient  Current Concerns:  Tedi Denkins is a 52 yr old female who presents to establish care for therapy. PPHx is significant Depression, 1 Suicide Attempt (OD on Tylenol 1980's), and 1 hospitalization (2011).   She reports ***      Current Symptoms: Anxiety and Depressed Mood *** Psychiatric Specialty Exam: General Appearance: Casual and Fairly Groomed  Eye Contact:  Good  Speech:  Clear and Coherent and Normal Rate  Volume:  Normal  Mood:  Anxious and Depressed  Affect:  Depressed and Tearful  Thought Process:  Coherent and Goal Directed  Orientation:  Full (Time, Place, and Person)  Thought Content:  Logical and Hallucinations: Auditory  Suicidal Thoughts:  No  Homicidal Thoughts:  No  Memory:  Immediate;   Good Recent;   Good  Judgement:  Good  Insight:  Good  Psychomotor Activity:  Normal  Concentration:  Concentration: Good and Attention Span: Good  Recall:  Good  Fund of Knowledge:Good  Language: Good  Akathisia:  Negative  Handed:  Left  AIMS (if indicated):  not done  Assets:  Communication Skills Desire for Improvement Resilience  ADL's:  Intact  Cognition: WNL  Sleep:  Fair     Diagnosis: MDD, GAD, PTSD, Auditory Hallucinations  Anticipated Frequency of Visits: Every 3 weeks Anticipated Length of Treatment Episode: 12-16  Short Term Goals/Goals for Treatment Session: Re-establish therapeutic bond *** Progress Towards Goals: Initial  Treatment Intervention: Supportive therapy  Medical Necessity: Prevented onset or worsening of patient condition  Assessment Tools:    06/22/2022    3:02 PM   Depression screen PHQ 2/9  Decreased Interest 1  Down, Depressed, Hopeless 1  PHQ - 2 Score 2  Altered sleeping 1  Tired, decreased energy 1  Change in appetite 1  Feeling bad or failure about yourself  1  Trouble concentrating 1  Moving slowly or fidgety/restless 0  Suicidal thoughts 0  PHQ-9 Score 7   Failed to redirect to the Timeline version of the REVFS SmartLink. Flowsheet Row ED from [facility]/29/2023 in [facility] at [facility] ED from 06/04/2022 in [facility] Department at [facility] ED from 02/23/2022 in [facility] Department at [facility]  C-SSRS RISK CATEGORY No Risk No Risk No Risk       Collaboration of Care:   Patient/Guardian was advised Release of Information must be obtained prior to any record release in order to collaborate their care with an outside provider. Patient/Guardian was advised if they have not already done so to contact the registration department to sign all necessary forms in order for [place] to release information regarding their care.   Consent: Patient/Guardian gives verbal consent for treatment and assignment of benefits for services provided during this visit. Patient/Guardian expressed understanding and agreed to proceed.   Plan: Provided talk/Supportive therapy.  She ***   MDD, Recurrent, Severe  GAD  PTSD  Auditory Hallucinations (r/o Post Ictal Psychosis): -Start Abilify 2 mg daily for depression, anxiety, and hallucinations.   Lauro Franklin, MD 11/19/2023

## 2023-11-23 ENCOUNTER — Emergency Department (HOSPITAL_COMMUNITY)
Admission: EM | Admit: 2023-11-23 | Discharge: 2023-11-24 | Disposition: A | Discharge status: Home / Self Care | Payer: Medicaid Other | Attending: Emergency Medicine | Admitting: Emergency Medicine

## 2023-11-23 ENCOUNTER — Encounter (HOSPITAL_COMMUNITY): Payer: Self-pay | Admitting: *Deleted

## 2023-11-23 ENCOUNTER — Other Ambulatory Visit: Payer: Self-pay

## 2023-11-23 DIAGNOSIS — R569 Unspecified convulsions: Secondary | ICD-10-CM | POA: Insufficient documentation

## 2023-11-23 DIAGNOSIS — I10 Essential (primary) hypertension: Secondary | ICD-10-CM | POA: Diagnosis not present

## 2023-11-23 DIAGNOSIS — F10129 Alcohol abuse with intoxication, unspecified: Secondary | ICD-10-CM | POA: Diagnosis present

## 2023-11-23 DIAGNOSIS — F10120 Alcohol abuse with intoxication, uncomplicated: Secondary | ICD-10-CM | POA: Insufficient documentation

## 2023-11-23 DIAGNOSIS — Z79899 Other long term (current) drug therapy: Secondary | ICD-10-CM | POA: Diagnosis not present

## 2023-11-23 DIAGNOSIS — F10920 Alcohol use, unspecified with intoxication, uncomplicated: Secondary | ICD-10-CM

## 2023-11-23 DIAGNOSIS — J45909 Unspecified asthma, uncomplicated: Secondary | ICD-10-CM | POA: Insufficient documentation

## 2023-11-23 LAB — I-STAT CHEM 8, ED
BUN: 4 mg/dL — ABNORMAL LOW (ref 6–20)
Calcium, Ion: 1.04 mmol/L — ABNORMAL LOW (ref 1.15–1.40)
Chloride: 97 mmol/L — ABNORMAL LOW (ref 98–111)
Creatinine, Ser: 1.1 mg/dL — ABNORMAL HIGH (ref 0.44–1.00)
Glucose, Bld: 97 mg/dL (ref 70–99)
HCT: 35 % — ABNORMAL LOW (ref 36.0–46.0)
Hemoglobin: 11.9 g/dL — ABNORMAL LOW (ref 12.0–15.0)
Potassium: 3.6 mmol/L (ref 3.5–5.1)
Sodium: 138 mmol/L (ref 135–145)
TCO2: 25 mmol/L (ref 22–32)

## 2023-11-23 NOTE — ED Triage Notes (Signed)
Pt arrives by [facility] from home.  Family friend called due to ETOH intoxication and ? Seizure like activity.  Hx of seizure per caller.  Pt is intoxicated, has been incontinend of urine and stool.  Pt is alert and oriented for ems, not post ictal.  Pt appears very intoxicated, hx of ETOH abuse unknown but pt has recently lost her son per ems. VS 116/79, HR74, RR18, spo2 96% on RA, CBG 97

## 2023-11-23 NOTE — ED Notes (Signed)
Pt was soiled with stool, helped her wash up at the sink with soap and water. Pt is alert and oriented, she feels that she had a seizure, she states that she has not been taking her seizure medication and that the med that she is prescribed begins with "L"

## 2023-11-23 NOTE — ED Notes (Signed)
Dr. Leonette Monarch speaking with pt
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
## 2023-11-26 ENCOUNTER — Telehealth: Payer: Self-pay | Admitting: Neurology

## 2023-11-26 NOTE — Telephone Encounter (Signed)
Pt wanted to let us know that she had another seizure the other day. There are ED notes in her chart from 12/10.

## 2023-11-29 NOTE — Telephone Encounter (Signed)
Returned call to patient. No answer and left message to return call.

## 2023-12-01 NOTE — Telephone Encounter (Signed)
Call to patient, no answer. Left message to return call. Mychart message sent as well.

## 2023-12-10 ENCOUNTER — Encounter (HOSPITAL_COMMUNITY): Payer: Self-pay | Admitting: Student in an Organized Health Care Education/Training Program

## 2023-12-17 ENCOUNTER — Ambulatory Visit (INDEPENDENT_AMBULATORY_CARE_PROVIDER_SITE_OTHER): Payer: PRIVATE HEALTH INSURANCE | Admitting: Student in an Organized Health Care Education/Training Program

## 2023-12-17 ENCOUNTER — Encounter (HOSPITAL_COMMUNITY): Payer: Self-pay | Admitting: Student in an Organized Health Care Education/Training Program

## 2023-12-17 DIAGNOSIS — F331 Major depressive disorder, recurrent, moderate: Secondary | ICD-10-CM

## 2023-12-17 DIAGNOSIS — F411 Generalized anxiety disorder: Secondary | ICD-10-CM | POA: Diagnosis not present

## 2023-12-17 DIAGNOSIS — F4310 Post-traumatic stress disorder, unspecified: Secondary | ICD-10-CM

## 2023-12-17 DIAGNOSIS — R440 Auditory hallucinations: Secondary | ICD-10-CM | POA: Diagnosis not present

## 2023-12-17 NOTE — Progress Notes (Addendum)
 [facility] [address] [place] [address] Dept: [phone] Dept Fax: [phone]  Psychotherapy Progress Note  Patient ID: [name], female  DOB: 04-26-1971, 53 y.o.  MRN: [id number]  12/17/2023 Start time: 10:03 AM End time: 10:46 AM  Method of Visit: Face-to-Face  Present: patient  Current Concerns:  [name] is a 53 yr old female who presents to establish care for therapy. PPHx is significant Depression, 1 Suicide Attempt (OD on Tylenol  1980's), and 1 hospitalization (2011).   She reports that she did recently have the EEG done.  She reports that she was told nothing was seen on it.  She reports she goes back to the neurologist for follow-up in April.  She reports that Thanksgiving was difficult.  She reports that a friend made comments about her son and her parenting.  She reports that this significantly hurt her and caused her to feel like she had nothing left to lose.  She reports that she was able to work through this and that she has not had any further episodes feeling like that.  She reports that Christmas and New Years were good.  She reports that her friend's daughter runs a restaurant downtown and they went there on New Years and it was a good time.  She reports she started the Abilify  and has had some improvement.  She reports she started about 3 weeks ago.  She reports for the last 2 weeks or so she has not had any AH.  She does report some dizziness but is not sure if this is due to the Abilify  or not.  Discussed with her that we would keep the dose of the Abilify  the same for right now but that at follow-up we would assess at that time whether to change her dose and she was agreeable with this.  Discussed with her what to do in the event of a future crisis.  Discussed that she can go to [facility], go to the nearest ED, or call [phone] or [phone].   She reported understanding and had no concerns.  Prior to leaving  the appointment she confirmed she was in a stable and safe mindset.  She reports no SI, HI, or AVH.   Current Symptoms: Anxiety and Depressed Mood  Psychiatric Specialty Exam: General Appearance: Casual and Fairly Groomed  Eye Contact:  Good  Speech:  Clear and Coherent and Normal Rate  Volume:  Normal  Mood:  Depressed  Affect:  Depressed and Tearful  Thought Process:  Coherent and Goal Directed  Orientation:  Full (Time, Place, and Person)  Thought Content:  Logical and Hallucinations: Auditory  Suicidal Thoughts:  No  Homicidal Thoughts:  No  Memory:  Immediate;   Good Recent;   Good  Judgement:  Good  Insight:  Good  Psychomotor Activity:  Normal  Concentration:  Concentration: Good and Attention Span: Good  Recall:  Good  Fund of Knowledge:Good  Language: Good  Akathisia:  Negative  Handed:  Left  AIMS (if indicated):  not done  Assets:  Communication Skills Desire for Improvement Resilience  ADL's:  Intact  Cognition: WNL  Sleep:  Fair     Diagnosis: MDD, GAD, PTSD, Auditory Hallucinations  Anticipated Frequency of Visits: Every 4 weeks Anticipated Length of Treatment Episode: 12-16  Short Term Goals/Goals for Treatment Session: Process grief/trauma Progress Towards Goals: Initial  Treatment Intervention: Supportive therapy  Medical Necessity: Prevented onset or worsening of patient condition  Assessment Tools:  06/22/2022    3:02 PM  Depression screen PHQ 2/9  Decreased Interest 1  Down, Depressed, Hopeless 1  PHQ - 2 Score 2  Altered sleeping 1  Tired, decreased energy 1  Change in appetite 1  Feeling bad or failure about yourself  1  Trouble concentrating 1  Moving slowly or fidgety/restless 0  Suicidal thoughts 0  PHQ-9 Score 7   Failed to redirect to the Timeline version of the REVFS SmartLink. Flowsheet Row ED from 11/23/2023 in [facility] Emergency Department at [facility] ED from 03/29/2023 in [facility] Emergency  Department at [facility] ED from 06/04/2022 in [facility] Emergency Department at [facility]  C-SSRS RISK CATEGORY No Risk No Risk No Risk       Collaboration of Care:   Patient/Guardian was advised Release of Information must be obtained prior to any record release in order to collaborate their care with an outside provider. Patient/Guardian was advised if they have not already done so to contact the registration department to sign all necessary forms in order for us  to release information regarding their care.   Consent: Patient/Guardian gives verbal consent for treatment and assignment of benefits for services provided during this visit. Patient/Guardian expressed understanding and agreed to proceed.   Plan: Provided talk/Supportive therapy.  She did have a significant event at Thanksgiving but has not had any SI since then.  She has had a positive response to starting Abilify .  She has had some dizziness but unsure if caused by Abilify .  Will consider increasing Abilify  at next appointment.  Prior to leaving the appointment she confirmed she was in a stable and safe mindset.  She reports no SI, HI, or AVH.    MDD, Recurrent, Severe  GAD  PTSD  Auditory Hallucinations (r/o Post Ictal Psychosis): -Continue Abilify  2 mg daily for depression, anxiety, and hallucinations.  No refills sent at this time.   Kristen GORMAN Rosser, MD 12/17/2023

## 2023-12-23 ENCOUNTER — Ambulatory Visit: Payer: Medicaid Other | Admitting: Family Medicine

## 2024-01-14 ENCOUNTER — Ambulatory Visit (HOSPITAL_COMMUNITY): Payer: 59 | Admitting: Student in an Organized Health Care Education/Training Program

## 2024-01-14 NOTE — Progress Notes (Unsigned)
  [facility] [address] [place] [address] Dept: [phone] Dept Fax: [phone]  Psychotherapy Progress Note  Patient ID: [name], female  DOB: Jul 22, 1971, 53 y.o.  MRN: [id number]  01/14/2024 Start time: 10:03 AM End time: 10:46 AM  Method of Visit: Face-to-Face  Present: patient  Current Concerns:  [name] is a 53 yr old female who presents to establish care for therapy. PPHx is significant Depression, 1 Suicide Attempt (OD on Tylenol 1980's), and 1 hospitalization (2011).   She reports ***   Current Symptoms: Anxiety and Depressed Mood  Psychiatric Specialty Exam: General Appearance: Casual and Fairly Groomed  Eye Contact:  Good  Speech:  Clear and Coherent and Normal Rate  Volume:  Normal  Mood:  Depressed  Affect:  Depressed and Tearful  Thought Process:  Coherent and Goal Directed  Orientation:  Full (Time, Place, and Person)  Thought Content:  Logical and Hallucinations: Auditory  Suicidal Thoughts:  No  Homicidal Thoughts:  No  Memory:  Immediate;   Good Recent;   Good  Judgement:  Good  Insight:  Good  Psychomotor Activity:  Normal  Concentration:  Concentration: Good and Attention Span: Good  Recall:  Good  Fund of Knowledge:Good  Language: Good  Akathisia:  Negative  Handed:  Left  AIMS (if indicated):  not done  Assets:  Communication Skills Desire for Improvement Resilience  ADL's:  Intact  Cognition: WNL  Sleep:  Fair     Diagnosis: MDD, GAD, PTSD, Auditory Hallucinations  Anticipated Frequency of Visits: Every 4 weeks Anticipated Length of Treatment Episode: 12-16  Short Term Goals/Goals for Treatment Session: Process grief/trauma Progress Towards Goals: Initial  Treatment Intervention: Supportive therapy  Medical Necessity: Prevented onset or worsening of patient condition  Assessment Tools:    06/22/2022    3:02 PM  Depression screen PHQ 2/9  Decreased  Interest 1  Down, Depressed, Hopeless 1  PHQ - 2 Score 2  Altered sleeping 1  Tired, decreased energy 1  Change in appetite 1  Feeling bad or failure about yourself  1  Trouble concentrating 1  Moving slowly or fidgety/restless 0  Suicidal thoughts 0  PHQ-9 Score 7   Failed to redirect to the Timeline version of the REVFS SmartLink. Flowsheet Row ED from 11/23/2023 in [facility] Emergency Department at [facility] ED from 03/29/2023 in [facility] Department at [facility] ED from 06/04/2022 in [facility] Emergency Department at [facility]  C-SSRS RISK CATEGORY No Risk No Risk No Risk       Collaboration of Care:   Patient/Guardian was advised Release of Information must be obtained prior to any record release in order to collaborate their care with an outside provider. Patient/Guardian was advised if they have not already done so to contact the registration department to sign all necessary forms in order for [place] to release information regarding their care.   Consent: Patient/Guardian gives verbal consent for treatment and assignment of benefits for services provided during this visit. Patient/Guardian expressed understanding and agreed to proceed.   Plan: Provided talk/Supportive therapy.  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  No refills sent at this time.   Lauro Franklin, MD 01/14/2024

## 2024-01-21 ENCOUNTER — Ambulatory Visit: Payer: 59 | Admitting: Family Medicine

## 2024-01-28 ENCOUNTER — Encounter (HOSPITAL_COMMUNITY): Payer: Self-pay | Admitting: Student in an Organized Health Care Education/Training Program

## 2024-01-28 ENCOUNTER — Ambulatory Visit (INDEPENDENT_AMBULATORY_CARE_PROVIDER_SITE_OTHER): Payer: PRIVATE HEALTH INSURANCE | Admitting: Student in an Organized Health Care Education/Training Program

## 2024-01-28 DIAGNOSIS — F411 Generalized anxiety disorder: Secondary | ICD-10-CM | POA: Diagnosis not present

## 2024-01-28 DIAGNOSIS — F331 Major depressive disorder, recurrent, moderate: Secondary | ICD-10-CM

## 2024-01-28 DIAGNOSIS — R440 Auditory hallucinations: Secondary | ICD-10-CM | POA: Diagnosis not present

## 2024-01-28 DIAGNOSIS — F4310 Post-traumatic stress disorder, unspecified: Secondary | ICD-10-CM | POA: Diagnosis not present

## 2024-01-28 MED ORDER — SERTRALINE HCL 50 MG PO TABS: 50.0000 mg | ORAL_TABLET | Freq: Every day | ORAL | 1 refills | Status: DC

## 2024-01-28 MED ORDER — ARIPIPRAZOLE 2 MG PO TABS: 2.0000 mg | ORAL_TABLET | Freq: Every day | ORAL | 1 refills | Status: DC

## 2024-01-28 NOTE — Progress Notes (Cosign Needed Addendum)
[facility] [address] [place] [address] Dept: [phone] Dept Fax: [phone]  Psychotherapy Progress Note  Patient ID: [name], female  DOB: [birth year], 53 y.o.  MRN: [id number]  01/28/2024 Start time: 11:01 AM End time: 11:46 AM  Method of Visit: Face-to-Face  Present: patient  Current Concerns:  [name] is a 53 yr old female who presents to establish care for therapy. PPHx is significant Depression, 1 Suicide Attempt (OD on Tylenol 1980's), and 1 hospitalization (2011).   She reports she has been doing a lot worse recently.  She reports she ran out of her Abilify a little over a week ago and had not refilled it yet.  She reports that since then the voices have returned and become a lot worse.  She reports worsening depression and anxiety as well.  She reports having felt sick for a while now.  She reports she will be able to see her new PCP for over a month.  Encouraged her to continue taking her Abilify as it had been helping with the voices.  Also discussed trialing Zoloft as it is first line treatment for PTSD.  Discussed potential risks and side effects and she was agreeable to the trial.  She reports she has been using deep breathing to try to help her sleep at night.  Encouraged her to continue using her coping skills.  Prior to leaving the appointment she confirmed she was in a stable and safe mindset.  She reports no SI, HI, or AVH.    Current Symptoms: Anxiety and Depressed Mood  Psychiatric Specialty Exam: General Appearance: Casual and Fairly Groomed  Eye Contact:  Good  Speech:  Clear and Coherent and Normal Rate  Volume:  Normal  Mood:  Depressed  Affect:  Depressed and Tearful  Thought Process:  Coherent and Goal Directed  Orientation:  Full (Time, Place, and Person)  Thought Content:  Logical and Hallucinations: Auditory  Suicidal Thoughts:  No  Homicidal Thoughts:  No  Memory:   Immediate;   Good Recent;   Good  Judgement:  Good  Insight:  Good  Psychomotor Activity:  Normal  Concentration:  Concentration: Good and Attention Span: Good  Recall:  Good  Fund of Knowledge:Good  Language: Good  Akathisia:  Negative  Handed:  Left  AIMS (if indicated):  not done  Assets:  Communication Skills Desire for Improvement Resilience  ADL's:  Intact  Cognition: WNL  Sleep:  Fair     Diagnosis: MDD, GAD, PTSD, Auditory Hallucinations  Anticipated Frequency of Visits: Every 4 weeks Anticipated Length of Treatment Episode: 12-16  Short Term Goals/Goals for Treatment Session: Start new medication and be consistent with taking it Progress Towards Goals: Initial  Treatment Intervention: Supportive therapy  Medical Necessity: Prevented onset or worsening of patient condition  Assessment Tools:    06/22/2022    3:02 PM  Depression screen PHQ 2/9  Decreased Interest 1  Down, Depressed, Hopeless 1  PHQ - 2 Score 2  Altered sleeping 1  Tired, decreased energy 1  Change in appetite 1  Feeling bad or failure about yourself  1  Trouble concentrating 1  Moving slowly or fidgety/restless 0  Suicidal thoughts 0  PHQ-9 Score 7   Failed to redirect to the Timeline version of the REVFS SmartLink. Flowsheet Row ED from 11/23/2023 in [facility] Emergency Department at [facility] ED from 03/29/2023 in [facility] Emergency Department at [facility] ED from  06/04/2022 in [facility] Department at [facility]  C-SSRS RISK CATEGORY No Risk No Risk No Risk       Collaboration of Care:   Patient/Guardian was advised Release of Information must be obtained prior to any record release in order to collaborate their care with an outside provider. Patient/Guardian was advised if they have not already done so to contact the registration department to sign all necessary forms in order for [place] to release information regarding their care.    Consent: Patient/Guardian gives verbal consent for treatment and assignment of benefits for services provided during this visit. Patient/Guardian expressed understanding and agreed to proceed.   Plan: Provided talk/Supportive therapy.  She ran out of her Abilify for the last week and has had significant worsening of her symptoms and return of her AH.  She will restart the Abilify.  To address her PTSD we will also start Zoloft.  Encouraged her to continue using her coping skills including breathing techniques.  Prior to leaving the appointment she confirmed she was in a stable and safe mindset.  She reports no SI, HI, or AVH.   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   30 tablets with 1 refill. -Start Zoloft 25 mg daily for 14 days then increase to 50 mg daily for depression and anxiety.  30 (50 mg) tablets with 1 refill.   Lauro Franklin, MD 01/28/2024

## 2024-01-31 ENCOUNTER — Emergency Department (HOSPITAL_COMMUNITY)
Admission: EM | Admit: 2024-01-31 | Discharge: 2024-02-01 | Disposition: A | Discharge status: Home / Self Care | Payer: 59 | Attending: Emergency Medicine | Admitting: Emergency Medicine

## 2024-01-31 ENCOUNTER — Other Ambulatory Visit: Payer: Self-pay

## 2024-01-31 ENCOUNTER — Emergency Department (HOSPITAL_COMMUNITY): Payer: 59

## 2024-01-31 ENCOUNTER — Encounter (HOSPITAL_COMMUNITY): Payer: Self-pay

## 2024-01-31 DIAGNOSIS — I10 Essential (primary) hypertension: Secondary | ICD-10-CM | POA: Diagnosis not present

## 2024-01-31 DIAGNOSIS — N390 Urinary tract infection, site not specified: Secondary | ICD-10-CM | POA: Insufficient documentation

## 2024-01-31 DIAGNOSIS — Z79899 Other long term (current) drug therapy: Secondary | ICD-10-CM | POA: Diagnosis not present

## 2024-01-31 DIAGNOSIS — R3915 Urgency of urination: Secondary | ICD-10-CM | POA: Diagnosis present

## 2024-01-31 DIAGNOSIS — J45909 Unspecified asthma, uncomplicated: Secondary | ICD-10-CM | POA: Insufficient documentation

## 2024-01-31 LAB — BASIC METABOLIC PANEL
Anion gap: 10 (ref 5–15)
BUN: 6 mg/dL (ref 6–20)
CO2: 26 mmol/L (ref 22–32)
Calcium: 9.2 mg/dL (ref 8.9–10.3)
Chloride: 98 mmol/L (ref 98–111)
Creatinine, Ser: 0.74 mg/dL (ref 0.44–1.00)
GFR, Estimated: >60 mL/min (ref >60)
Glucose, Bld: 96 mg/dL (ref 70–99)
Potassium: 3.3 mmol/L — ABNORMAL LOW (ref 3.5–5.1)
Sodium: 134 mmol/L — ABNORMAL LOW (ref 135–145)

## 2024-01-31 LAB — URINALYSIS, W/ REFLEX TO CULTURE (INFECTION SUSPECTED)
Bilirubin Urine: NEGATIVE
Glucose, UA: NEGATIVE mg/dL
Ketones, ur: NEGATIVE mg/dL
Nitrite: NEGATIVE
Protein, ur: NEGATIVE mg/dL
RBC / HPF: >50 RBC/hpf (ref 0–5)
Specific Gravity, Urine: 1.01 (ref 1.005–1.030)
WBC, UA: >50 WBC/hpf (ref 0–5)
pH: 6 (ref 5.0–8.0)

## 2024-01-31 LAB — CBC WITH DIFFERENTIAL/PLATELET
Abs Immature Granulocytes: 0.01 10*3/uL (ref 0.00–0.07)
Basophils Absolute: 0 10*3/uL (ref 0.0–0.1)
Basophils Relative: 0 %
Eosinophils Absolute: 0.1 10*3/uL (ref 0.0–0.5)
Eosinophils Relative: 2 %
HCT: 32 % — ABNORMAL LOW (ref 36.0–46.0)
Hemoglobin: 10 g/dL — ABNORMAL LOW (ref 12.0–15.0)
Immature Granulocytes: 0 %
Lymphocytes Relative: 33 %
Lymphs Abs: 1.7 10*3/uL (ref 0.7–4.0)
MCH: 24.9 pg — ABNORMAL LOW (ref 26.0–34.0)
MCHC: 31.3 g/dL (ref 30.0–36.0)
MCV: 79.6 fL — ABNORMAL LOW (ref 80.0–100.0)
Monocytes Absolute: 0.8 10*3/uL (ref 0.1–1.0)
Monocytes Relative: 15 %
Neutro Abs: 2.5 10*3/uL (ref 1.7–7.7)
Neutrophils Relative %: 50 %
Platelets: 231 10*3/uL (ref 150–400)
RBC: 4.02 MIL/uL (ref 3.87–5.11)
RDW: 18.7 % — ABNORMAL HIGH (ref 11.5–15.5)
WBC: 5 10*3/uL (ref 4.0–10.5)
nRBC: 0 % (ref 0.0–0.2)

## 2024-01-31 LAB — RESP PANEL BY RT-PCR (RSV, FLU A&B, COVID)  RVPGX2
Influenza A by PCR: NEGATIVE
Influenza B by PCR: NEGATIVE
Resp Syncytial Virus by PCR: NEGATIVE
SARS Coronavirus 2 by RT PCR: NEGATIVE

## 2024-01-31 MED ORDER — ACETAMINOPHEN 500 MG PO TABS
1000.0000 mg | ORAL_TABLET | Freq: Once | ORAL | Status: AC
  Administered 2024-01-31: 1000 mg via ORAL

## 2024-01-31 MED ORDER — KETOROLAC TROMETHAMINE 15 MG/ML IJ SOLN
15.0000 mg | Freq: Once | INTRAMUSCULAR | Status: AC
  Administered 2024-01-31: 15 mg via INTRAMUSCULAR
  Filled 2024-01-31: qty 1

## 2024-01-31 MED ORDER — ONDANSETRON HCL 4 MG/2ML IJ SOLN
4.0000 mg | INTRAMUSCULAR | Status: AC | PRN
  Administered 2024-02-01: 4 mg via INTRAVENOUS
  Filled 2024-01-31: qty 2

## 2024-01-31 MED ORDER — MORPHINE SULFATE (PF) 4 MG/ML IV SOLN
4.0000 mg | Freq: Once | INTRAVENOUS | Status: AC
  Administered 2024-02-01: 4 mg via INTRAVENOUS
  Filled 2024-01-31: qty 1

## 2024-01-31 MED ORDER — IOHEXOL 350 MG/ML SOLN
75.0000 mL | Freq: Once | INTRAVENOUS | Status: AC | PRN
  Administered 2024-01-31: 75 mL via INTRAVENOUS

## 2024-01-31 NOTE — ED Triage Notes (Addendum)
Patient complains of UTI symptoms and taking left over anitbiotics.  Also reports dizziness and possible low hgb bc of her menstural 2 weeks ago.  Patient also report sick exposure and husband is here getting tested for the flu. Reports started on new antidepressant and unsure if that is causing her to  be dizzy

## 2024-01-31 NOTE — ED Provider Triage Note (Signed)
Emergency Medicine Provider Triage Evaluation Note  [name] , a 53 y.o. female  was evaluated in triage.  Pt complains of dysuria.  Started 4 days ago.  Also endorses malodorous urine.  Also mentioned that she has had heavy bleeding with last menstrual cycle last month.  No abnormal bleeding or discharge today.  Reports "vaginal pain" but denies any other pain in the pelvis or abdomen.  Denies fever.  Also states she has been somewhat dizzy as well.  She attributes this to starting new antidepressants.  Review of Systems  Positive: See above Negative: See above  Physical Exam  BP (!) 137/97 (BP Location: Right Arm)   Pulse 81   Temp 99.5 F (37.5 C) (Oral)   Resp 20   Ht 5\' 3"  (1.6 m)   Wt 80.7 kg   SpO2 100%   BMI 31.53 kg/m  Gen:   Awake, no distress   Resp:  Normal effort  MSK:   Moves extremities without difficulty  Other:    Medical Decision Making  Medically screening exam initiated at 2:53 PM.  Appropriate orders placed.  Venia Carbon was informed that the remainder of the evaluation will be completed by another provider, this initial triage assessment does not replace that evaluation, and the importance of remaining in the ED until their evaluation is complete.  Work up started   Gareth Eagle, PA-C 01/31/24 1454
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
## 2024-02-01 DIAGNOSIS — N390 Urinary tract infection, site not specified: Secondary | ICD-10-CM | POA: Diagnosis not present

## 2024-02-01 LAB — URINE CULTURE: Culture: NO GROWTH

## 2024-02-01 LAB — PREGNANCY, URINE: Preg Test, Ur: NEGATIVE

## 2024-02-01 MED ORDER — CEPHALEXIN 500 MG PO CAPS: 500.0000 mg | ORAL_CAPSULE | Freq: Two times a day (BID) | ORAL | 0 refills | Status: AC

## 2024-02-01 MED ORDER — CEPHALEXIN 250 MG PO CAPS
500.0000 mg | ORAL_CAPSULE | Freq: Once | ORAL | Status: AC
  Administered 2024-02-01: 500 mg via ORAL
  Filled 2024-02-01: qty 2

## 2024-02-01 NOTE — Discharge Instructions (Addendum)
Thank you for letting us evaluate you today.  Your CT scan was negative for infected stone nor kidney nor bladder infection.  Your urine was concerning for infection.  Will treat this with Keflex.  Please pick this up and start treatment tomorrow.  Your urine culture is pending.  We will contact you if this requires a change in antibiotics.  Please take Tylenol and ibuprofen intermittently every 4 hours for pain.  Return to emergency department if you experience worsening of symptoms.

## 2024-02-07 ENCOUNTER — Telehealth (HOSPITAL_COMMUNITY): Payer: Self-pay

## 2024-02-07 ENCOUNTER — Telehealth (HOSPITAL_COMMUNITY): Payer: Self-pay | Admitting: Student in an Organized Health Care Education/Training Program

## 2024-02-07 DIAGNOSIS — F331 Major depressive disorder, recurrent, moderate: Secondary | ICD-10-CM

## 2024-02-07 DIAGNOSIS — F4310 Post-traumatic stress disorder, unspecified: Secondary | ICD-10-CM

## 2024-02-07 DIAGNOSIS — F411 Generalized anxiety disorder: Secondary | ICD-10-CM

## 2024-02-07 DIAGNOSIS — R440 Auditory hallucinations: Secondary | ICD-10-CM

## 2024-02-07 NOTE — Telephone Encounter (Signed)
 Pt states that she is trying to get prior Auth done for ARIPiprazole (ABILIFY) 2 MG tablet, when trying to pick up your medication.   She says that she urgently needs medicine been out about 1 or two weeks.

## 2024-02-10 ENCOUNTER — Other Ambulatory Visit: Payer: Self-pay

## 2024-02-10 ENCOUNTER — Telehealth (HOSPITAL_COMMUNITY): Payer: Self-pay | Admitting: *Deleted

## 2024-02-10 MED ORDER — ARIPIPRAZOLE 2 MG PO TABS
2.0000 mg | ORAL_TABLET | Freq: Every day | ORAL | 1 refills | Status: DC
  Filled 2024-02-10: qty 30, 30d supply, fill #0

## 2024-02-10 NOTE — Telephone Encounter (Deleted)
 PA for patients aripiprazole has been approved thru 02/09/25. Pharmacy notified.

## 2024-02-10 NOTE — Telephone Encounter (Signed)
 Submitted PA for aripiprazole thru covermymeds and received an approval thru [employer] till 02/09/25. I called her [facility] on [address] but told by pharmacist could not submit it for release because the system says she has a different pay source and she has already been told by the pharmacy she has to straighten it out thru Medicaid. If she pays for it out of her pocket it would be 97.00 which probably is not realistic for her.

## 2024-02-10 NOTE — Addendum Note (Signed)
 Addended by: Lauro Franklin on: 02/10/2024 04:00 PM   Modules accepted: Orders

## 2024-02-10 NOTE — Telephone Encounter (Signed)
 Submitted a PA for patients Abilify on covermymeds. Waiting for response

## 2024-02-10 NOTE — Telephone Encounter (Signed)
 Patient called reporting that prescription cost would be $40.  Staff asked if medications could be sent to [facility] and she was agreeable.   Sent: -Abilify 2 mg daily.  30 tablets with 1 refill.   [name] MD Resident

## 2024-02-11 ENCOUNTER — Other Ambulatory Visit: Payer: Self-pay

## 2024-02-11 ENCOUNTER — Ambulatory Visit (HOSPITAL_COMMUNITY): Payer: Medicaid Other | Admitting: Student in an Organized Health Care Education/Training Program

## 2024-02-11 NOTE — Progress Notes (Unsigned)
  [facility] [address] [place] [address] Dept: [phone] Dept Fax: [phone]  Psychotherapy Progress Note  Patient ID: [name], female  DOB: 26-Dec-1970, 53 y.o.  MRN: [id number]  02/11/2024 Start time: 10:*** AM End time: 10:46 AM  Method of Visit: Face-to-Face  Present: patient  Current Concerns:  [name] is a 53 yr old female who presents to establish care for therapy. PPHx is significant Depression, 1 Suicide Attempt (OD on Tylenol 1980's), and 1 hospitalization (2011).   She reports ***   Current Symptoms: Anxiety and Depressed Mood  Psychiatric Specialty Exam: General Appearance: Casual and Fairly Groomed  Eye Contact:  Good  Speech:  Clear and Coherent and Normal Rate  Volume:  Normal  Mood:  Depressed  Affect:  Depressed and Tearful  Thought Process:  Coherent and Goal Directed  Orientation:  Full (Time, Place, and Person)  Thought Content:  Logical and Hallucinations: Auditory  Suicidal Thoughts:  No  Homicidal Thoughts:  No  Memory:  Immediate;   Good Recent;   Good  Judgement:  Good  Insight:  Good  Psychomotor Activity:  Normal  Concentration:  Concentration: Good and Attention Span: Good  Recall:  Good  Fund of Knowledge:Good  Language: Good  Akathisia:  Negative  Handed:  Left  AIMS (if indicated):  not done  Assets:  Communication Skills Desire for Improvement Resilience  ADL's:  Intact  Cognition: WNL  Sleep:  Fair     Diagnosis: MDD, GAD, PTSD, Auditory Hallucinations  Anticipated Frequency of Visits: Every 4 weeks Anticipated Length of Treatment Episode: 12-16  Short Term Goals/Goals for Treatment Session: Process grief/trauma Progress Towards Goals: Initial  Treatment Intervention: Supportive therapy  Medical Necessity: Prevented onset or worsening of patient condition  Assessment Tools:    06/22/2022    3:02 PM  Depression screen PHQ 2/9   Decreased Interest 1  Down, Depressed, Hopeless 1  PHQ - 2 Score 2  Altered sleeping 1  Tired, decreased energy 1  Change in appetite 1  Feeling bad or failure about yourself  1  Trouble concentrating 1  Moving slowly or fidgety/restless 0  Suicidal thoughts 0  PHQ-9 Score 7   Failed to redirect to the Timeline version of the REVFS SmartLink. Flowsheet Row ED from 01/31/2024 in [facility] Emergency Department at [facility] ED from 11/23/2023 in [facility] Department at [facility] ED from 03/29/2023 in [facility] Emergency Department at [facility]  C-SSRS RISK CATEGORY No Risk No Risk No Risk       Collaboration of Care:   Patient/Guardian was advised Release of Information must be obtained prior to any record release in order to collaborate their care with an outside provider. Patient/Guardian was advised if they have not already done so to contact the registration department to sign all necessary forms in order for [place] to release information regarding their care.   Consent: Patient/Guardian gives verbal consent for treatment and assignment of benefits for services provided during this visit. Patient/Guardian expressed understanding and agreed to proceed.   Plan: Provided talk/Supportive therapy.  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  No refills sent at this time.   Lauro Franklin, MD 02/11/2024
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
## 2024-02-23 NOTE — Patient Instructions (Incomplete)
 Welcome to [facility]!  Thank you for choosing [place] for your Primary Care needs.   We offer in person and video appointments for your convenience. You may call our office to schedule appointments, or you may schedule appointments with me through MyChart.   The best way to get in contact with me is via MyChart message. This will get to me faster than a phone call, unless there is an emergency, then please call 911.  The lab is located downstairs in the Sports Medicine building, we also have xray available there.   Follow up with me in about a month to recheck your blood pressure.

## 2024-02-25 ENCOUNTER — Ambulatory Visit: Payer: 59 | Admitting: Family Medicine

## 2024-02-25 ENCOUNTER — Encounter: Payer: Self-pay | Admitting: Family Medicine

## 2024-02-25 VITALS — BP 138/90 | HR 84 | Temp 98.5°F | Ht 63.0 in | Wt 172.8 lb

## 2024-02-25 DIAGNOSIS — N920 Excessive and frequent menstruation with regular cycle: Secondary | ICD-10-CM

## 2024-02-25 DIAGNOSIS — Z981 Arthrodesis status: Secondary | ICD-10-CM

## 2024-02-25 DIAGNOSIS — I10 Essential (primary) hypertension: Secondary | ICD-10-CM

## 2024-02-25 DIAGNOSIS — M5412 Radiculopathy, cervical region: Secondary | ICD-10-CM

## 2024-02-25 DIAGNOSIS — Z720 Tobacco use: Secondary | ICD-10-CM

## 2024-02-25 DIAGNOSIS — F1090 Alcohol use, unspecified, uncomplicated: Secondary | ICD-10-CM

## 2024-02-25 DIAGNOSIS — Z79899 Other long term (current) drug therapy: Secondary | ICD-10-CM | POA: Insufficient documentation

## 2024-02-25 DIAGNOSIS — M542 Cervicalgia: Secondary | ICD-10-CM

## 2024-02-25 DIAGNOSIS — F1290 Cannabis use, unspecified, uncomplicated: Secondary | ICD-10-CM

## 2024-02-25 DIAGNOSIS — Z1231 Encounter for screening mammogram for malignant neoplasm of breast: Secondary | ICD-10-CM

## 2024-02-25 MED ORDER — HYDROCHLOROTHIAZIDE 25 MG PO TABS: 25.0000 mg | ORAL_TABLET | Freq: Every day | ORAL | 0 refills | Status: DC

## 2024-03-09 ENCOUNTER — Ambulatory Visit: Payer: Self-pay | Admitting: Family Medicine

## 2024-03-09 NOTE — Telephone Encounter (Signed)
 Copied from CRM [id number]. Topic: Clinical - Red Word Triage >> Mar 09, 2024 11:05 AM [name] wrote: Red Word that prompted transfer to Nurse Triage: right ear ache, very painful, coughing up phlegm, cold and waking up and night covered in sweat, fever  Chief Complaint: Ear pain Symptoms: cough, runny nose Frequency: constant Pertinent Negatives: Patient denies shortness of breath Disposition: [] ED /[] Urgent Care (no appt availability in office) / [x] Appointment(In office/virtual)/ []  [place] [facility]/ [] Home Care/ [] Refused Recommended Disposition /[] [facility]/ []  Follow-up with PCP Additional Notes: Pt reports ear pain x  week, also having a productive cough and runny nose. Pt unable to come in person for a visit due to lack of transportation Opted for virtual visit on 3/28 at 2:20p  Reason for Disposition  Earache  (Exceptions: brief ear pain of < 60 minutes duration, earache occurring during air travel  Answer Assessment - Initial Assessment Questions 1. LOCATION: "Which ear is involved?"     Right ear  2. ONSET: "When did the ear start hurting"     1 week  3. SEVERITY: "How bad is the pain?"  (Scale 1-10; mild, moderate or severe)   - MILD (1-3): doesn't interfere with normal activities    - MODERATE (4-7): interferes with normal activities or awakens from sleep    - SEVERE (8-10): excruciating pain, unable to do any normal activities      7/10 pain  4. URI SYMPTOMS: "Do you have a runny nose or cough?"     Cough and runny nose  5. FEVER: "Do you have a fever?" If Yes, ask: "What is your temperature, how was it measured, and when did it start?"     Likely having fever due to cold chills  6. CAUSE: "Have you been swimming recently?", "How often do you use Q-TIPS?", "Have you had any recent air travel or scuba diving?"     No  7. OTHER SYMPTOMS: "Do you have any other symptoms?" (e.g., headache, stiff neck, dizziness, vomiting, runny nose, decreased  hearing)     Headache  8. PREGNANCY: "Is there any chance you are pregnant?" "When was your last menstrual period?"     No; LMP last month  Protocols used: Davina Poke

## 2024-03-10 ENCOUNTER — Encounter (HOSPITAL_COMMUNITY): Payer: Self-pay | Admitting: Student in an Organized Health Care Education/Training Program

## 2024-03-10 ENCOUNTER — Ambulatory Visit (INDEPENDENT_AMBULATORY_CARE_PROVIDER_SITE_OTHER): Payer: PRIVATE HEALTH INSURANCE | Admitting: Student in an Organized Health Care Education/Training Program

## 2024-03-10 ENCOUNTER — Encounter: Payer: Self-pay | Admitting: Family Medicine

## 2024-03-10 ENCOUNTER — Telehealth: Admitting: Family Medicine

## 2024-03-10 DIAGNOSIS — F411 Generalized anxiety disorder: Secondary | ICD-10-CM

## 2024-03-10 DIAGNOSIS — R440 Auditory hallucinations: Secondary | ICD-10-CM

## 2024-03-10 DIAGNOSIS — I10 Essential (primary) hypertension: Secondary | ICD-10-CM

## 2024-03-10 DIAGNOSIS — F4310 Post-traumatic stress disorder, unspecified: Secondary | ICD-10-CM

## 2024-03-10 DIAGNOSIS — F331 Major depressive disorder, recurrent, moderate: Secondary | ICD-10-CM | POA: Diagnosis not present

## 2024-03-10 DIAGNOSIS — J069 Acute upper respiratory infection, unspecified: Secondary | ICD-10-CM

## 2024-03-10 DIAGNOSIS — H9203 Otalgia, bilateral: Secondary | ICD-10-CM

## 2024-03-10 DIAGNOSIS — R051 Acute cough: Secondary | ICD-10-CM

## 2024-03-10 DIAGNOSIS — Z79899 Other long term (current) drug therapy: Secondary | ICD-10-CM

## 2024-03-10 DIAGNOSIS — B9689 Other specified bacterial agents as the cause of diseases classified elsewhere: Secondary | ICD-10-CM

## 2024-03-10 MED ORDER — SERTRALINE HCL 50 MG PO TABS: 50.0000 mg | ORAL_TABLET | Freq: Every day | ORAL | 1 refills | Status: AC

## 2024-03-10 MED ORDER — HYDROCHLOROTHIAZIDE 25 MG PO TABS: 25.0000 mg | ORAL_TABLET | Freq: Every day | ORAL | 0 refills | Status: DC

## 2024-03-10 MED ORDER — ARIPIPRAZOLE 2 MG PO TABS: 2.0000 mg | ORAL_TABLET | Freq: Every day | ORAL | 1 refills | Status: AC

## 2024-03-10 MED ORDER — AZITHROMYCIN 250 MG PO TABS: ORAL_TABLET | ORAL | 0 refills | Status: AC

## 2024-03-10 MED ORDER — LAMOTRIGINE 100 MG PO TABS: 100.0000 mg | ORAL_TABLET | Freq: Every day | ORAL | 0 refills | Status: DC

## 2024-03-10 NOTE — Progress Notes (Signed)
 [facility] [address] [place] [address] Dept: [phone] Dept Fax: [phone]  Psychotherapy Progress Note  Virtual Visit via Video Note  I connected with [name] on 03/10/24 at 11:00 AM EDT by a video enabled telemedicine application and verified that I am speaking with the correct person using two identifiers.  Location: Patient: Home/walking around neighborhood Provider: [facility]   I discussed the limitations of evaluation and management by telemedicine and the availability of in person appointments. The patient expressed understanding and agreed to proceed.   Patient ID: [name], female  DOB: 1971/06/07, 53 y.o.  MRN: [id number]  03/10/2024 Start time: 11:00 AM End time: 11:46 AM  Method of Visit: Face-to-Face  Present: patient  Current Concerns:  [name] is a 53 yr old female who presents to establish care for therapy. PPHx is significant Depression, 1 Suicide Attempt (OD on Tylenol 1980's), and 1 hospitalization (2011).   She reports she was unable to coming person today due to having a bad ear infection.  She reports she does have an appointment with her PCP later this afternoon to address it.  She reports her fever finally broke last night so she is hopeful that she is starting to get better.  She reports some sadness today due to her uncle in [place] passing away 2 days ago but that she is managing it well enough overall.  She reports that she was able to get her Abilify and that it has been helpful.  She reports she is no longer hearing the voices.  She reports that things that used to get her upset she is able to handle better.  She reports that she still plans on moving but is unsure of where.  Reports that the people she is around in [place] are not making changes and for herself she needs to make the change.  She reports that she is considering [place] where her family is or possibly  [place] where her niece is who has been encouraging her to move.  She reports first she we will get all of her health issues sorted out because she continues to have issues from her neck surgery.  She reports she was denied disability but her lawyer is appealing it.  She reports that despite all this she does feel like she is doing better.  She reports taking things day by day.  She reports that taking walks is often helpful for her and encouraged her to do this daily.  Discussed potentially journaling and other coping skills to help manage her stress.  Prior to leaving the appointment she confirmed she was in a stable and safe mindset.  She reports no SI, HI, or AVH.   Current Symptoms: Anxiety and Depressed Mood  Psychiatric Specialty Exam: General Appearance: Casual and Fairly Groomed  Eye Contact:  Good  Speech:  Clear and Coherent and Normal Rate  Volume:  Normal  Mood:  Anxious and Dysphoric  Affect:  Congruent  Thought Process:  Coherent and Goal Directed  Orientation:  Full (Time, Place, and Person)  Thought Content:  Logical and Hallucinations: Auditory  Suicidal Thoughts:  No  Homicidal Thoughts:  No  Memory:  Immediate;   Good Recent;   Good  Judgement:  Good  Insight:  Good  Psychomotor Activity:  Normal  Concentration:  Concentration: Good and Attention Span: Good  Recall:  Good  Fund of Knowledge:Good  Language: Good  Akathisia:  Negative  Handed:  Left  AIMS (if indicated):  not done  Assets:  Communication Skills Desire for Improvement Resilience  ADL's:  Intact  Cognition: WNL  Sleep:  Fair     Diagnosis: MDD, GAD, PTSD, Auditory Hallucinations  Anticipated Frequency of Visits: Every 4 weeks Anticipated Length of Treatment Episode: 12-16  Short Term Goals/Goals for Treatment Session: Work on coping skills Progress Towards Goals: Initial  Treatment Intervention: Supportive therapy  Medical Necessity: Prevented onset or worsening of patient  condition  Assessment Tools:    06/22/2022    3:02 PM  Depression screen PHQ 2/9  Decreased Interest 1  Down, Depressed, Hopeless 1  PHQ - 2 Score 2  Altered sleeping 1  Tired, decreased energy 1  Change in appetite 1  Feeling bad or failure about yourself  1  Trouble concentrating 1  Moving slowly or fidgety/restless 0  Suicidal thoughts 0  PHQ-9 Score 7   Failed to redirect to the Timeline version of the REVFS SmartLink. Flowsheet Row ED from 01/31/2024 in [facility] Emergency Department at [facility] ED from 11/23/2023 in [facility] Department at [facility] ED from 03/29/2023 in [facility] at [facility]  C-SSRS RISK CATEGORY No Risk No Risk No Risk       Collaboration of Care:   Patient/Guardian was advised Release of Information must be obtained prior to any record release in order to collaborate their care with an outside provider. Patient/Guardian was advised if they have not already done so to contact the registration department to sign all necessary forms in order for [place] to release information regarding their care.   Consent: Patient/Guardian gives verbal consent for treatment and assignment of benefits for services provided during this visit. Patient/Guardian expressed understanding and agreed to proceed.   Plan: Provided talk/Supportive therapy.  She continues to have significant stress due to her living situation but is managing it relatively well.  Encouraged her to continue using coping skills and physical activity.  Prior to leaving the appointment she confirmed she was in a stable and safe mindset.  She reports no SI, HI, or AVH.     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  No refills sent at this time.   Lauro Franklin, MD 03/10/2024     Follow Up Instructions:    I  discussed the assessment and treatment plan with the patient. The patient was provided an opportunity to ask questions and all were answered. The patient agreed with the plan and demonstrated an understanding of the instructions.   The patient was advised to call back or seek an in-person evaluation if the symptoms worsen or if the condition fails to improve as anticipated.  I provided 46 minutes of non-face-to-face time during this encounter.   Lauro Franklin, MD

## 2024-03-10 NOTE — Progress Notes (Deleted)
   Acute Office Visit  Subjective:     Patient ID: [name], female    DOB: [birth year], 53 y.o.   MRN: [id number]  No chief complaint on file.   HPI Patient is in today for ***  ROS Per HPI      Objective:    There were no vitals taken for this visit.   Physical Exam Vitals and nursing note reviewed.  Constitutional:      General: She is not in acute distress. HENT:     Head: Normocephalic and atraumatic.     Nose: No congestion.     Mouth/Throat:     Mouth: Mucous membranes are moist.     Pharynx: Oropharynx is clear. No oropharyngeal exudate or posterior oropharyngeal erythema.  Eyes:     Extraocular Movements: Extraocular movements intact.  Cardiovascular:     Rate and Rhythm: Normal rate and regular rhythm.  Pulmonary:     Effort: Pulmonary effort is normal. No respiratory distress.     Breath sounds: No wheezing, rhonchi or rales.  Musculoskeletal:     Cervical back: Normal range of motion and neck supple.  Lymphadenopathy:     Cervical: No cervical adenopathy.  Skin:    General: Skin is warm and dry.  Neurological:     Mental Status: She is alert.   No results found for any visits on 03/10/24.      Assessment & Plan:   Acute cough  Otalgia of both ears     No orders of the defined types were placed in this encounter.   No follow-ups on file.  Moshe Cipro, FNP

## 2024-03-10 NOTE — Progress Notes (Signed)
 Virtual Visit via Video Note  I connected with [name] on 03/13/24 at  2:20 PM EDT by a video enabled telemedicine application and verified that I am speaking with the correct person using two identifiers.  Patient Location: Home Provider Location: Office/Clinic  I discussed the limitations, risks, security, and privacy concerns of performing an evaluation and management service by video and the availability of in person appointments. I also discussed with the patient that there may be a patient responsible charge related to this service. The patient expressed understanding and agreed to proceed.  Subjective: PCP: Sherald Barge, FNP  Chief Complaint  Patient presents with   Acute Visit    Earache, productive cough, yellow mucus, is having headaches. Has been trying tylenol and ibuprofen   HPI 53 year old female presents for A/V visit today for evaluation of cough, headache, sinus pain, congestion for the last 2 weeks. Has taken OTC cough and cold with little relief. States that she was unable to pick up her medications at the last visit, requesting refills today. Denies known sick contacts. Denies abdominal pain, nausea, vomiting, diarrhea, rash, fever, chills, other symptoms. Denies other concerns. Medical history as outlined below.  ROS: Per HPI  Current Outpatient Medications:    azithromycin (ZITHROMAX) 250 MG tablet, Take 2 tablets on day 1, then 1 tablet daily on days 2 through 5, Disp: 6 tablet, Rfl: 0   ARIPiprazole (ABILIFY) 2 MG tablet, Take 1 tablet (2 mg total) by mouth daily., Disp: 30 tablet, Rfl: 1   hydrochlorothiazide (HYDRODIURIL) 25 MG tablet, Take 1 tablet (25 mg total) by mouth daily., Disp: 90 tablet, Rfl: 0   ibuprofen (ADVIL,MOTRIN) 600 MG tablet, Take 1 tablet (600 mg total) by mouth every 6 (six) hours as needed., Disp: 30 tablet, Rfl: 0   lamoTRIgine (LAMICTAL) 100 MG tablet, Take 1 tablet (100 mg total) by mouth daily., Disp: 90 tablet,  Rfl: 0   lamoTRIgine (LAMICTAL) 25 MG tablet, Take 1 tablet (25 mg total) by mouth daily for 7 days, THEN 2 tablets (50 mg total) daily for 7 days, THEN 3 tablets (75 mg total) daily for 7 days, THEN 4 tablets (100 mg total) daily for 7 days., Disp: 100 tablet, Rfl: 0   sertraline (ZOLOFT) 50 MG tablet, Take 1 tablet (50 mg total) by mouth daily. Take a half tablet (25 mg) for 14 days then take a whole tablet (50 mg) daily, Disp: 30 tablet, Rfl: 1  Observations/Objective: There were no vitals filed for this visit. Physical Exam Vitals and nursing note reviewed.  Constitutional:      General: She is not in acute distress.    Comments: Appears fatigued  HENT:     Head: Normocephalic and atraumatic.     Nose:     Comments: Sounds congested and hoarse Eyes:     Extraocular Movements: Extraocular movements intact.  Pulmonary:     Effort: Pulmonary effort is normal.     Comments: Productive cough during A/V visit Musculoskeletal:     Cervical back: Normal range of motion.  Neurological:     General: No focal deficit present.     Mental Status: She is alert and oriented to person, place, and time.  Psychiatric:        Mood and Affect: Mood normal.        Behavior: Behavior normal.     Assessment and Plan: Bacterial URI -     Azithromycin; Take 2 tablets on day 1, then 1  tablet daily on days 2 through 5  Dispense: 6 tablet; Refill: 0  MDD (major depressive disorder), recurrent episode, moderate (HCC) -     ARIPiprazole; Take 1 tablet (2 mg total) by mouth daily.  Dispense: 30 tablet; Refill: 1 -     Sertraline HCl; Take 1 tablet (50 mg total) by mouth daily. Take a half tablet (25 mg) for 14 days then take a whole tablet (50 mg) daily  Dispense: 30 tablet; Refill: 1  GAD (generalized anxiety disorder) -     ARIPiprazole; Take 1 tablet (2 mg total) by mouth daily.  Dispense: 30 tablet; Refill: 1 -     Sertraline HCl; Take 1 tablet (50 mg total) by mouth daily. Take a half tablet (25  mg) for 14 days then take a whole tablet (50 mg) daily  Dispense: 30 tablet; Refill: 1  PTSD (post-traumatic stress disorder) -     ARIPiprazole; Take 1 tablet (2 mg total) by mouth daily.  Dispense: 30 tablet; Refill: 1 -     Sertraline HCl; Take 1 tablet (50 mg total) by mouth daily. Take a half tablet (25 mg) for 14 days then take a whole tablet (50 mg) daily  Dispense: 30 tablet; Refill: 1  Auditory hallucinations -     ARIPiprazole; Take 1 tablet (2 mg total) by mouth daily.  Dispense: 30 tablet; Refill: 1 -     Sertraline HCl; Take 1 tablet (50 mg total) by mouth daily. Take a half tablet (25 mg) for 14 days then take a whole tablet (50 mg) daily  Dispense: 30 tablet; Refill: 1  Primary hypertension -     hydroCHLOROthiazide; Take 1 tablet (25 mg total) by mouth daily.  Dispense: 90 tablet; Refill: 0  Medication management Assessment & Plan: Medications refilled today    Other orders -     lamoTRIgine; Take 1 tablet (100 mg total) by mouth daily.  Dispense: 90 tablet; Refill: 0    Follow Up Instructions: Return for sa scheduled.   I discussed the assessment and treatment plan with the patient. The patient was provided an opportunity to ask questions, and all were answered. The patient agreed with the plan and demonstrated an understanding of the instructions.   The patient was advised to call back or seek an in-person evaluation if the symptoms worsen or if the condition fails to improve as anticipated.  The above assessment and management plan was discussed with the patient. The patient verbalized understanding of and has agreed to the management plan.   Sherald Barge, FNP

## 2024-03-13 ENCOUNTER — Encounter: Payer: Self-pay | Admitting: Family Medicine

## 2024-03-13 NOTE — Assessment & Plan Note (Signed)
Medications refilled today

## 2024-03-13 NOTE — Patient Instructions (Addendum)
 I have sent in azithromycin for you to take.  Take 2 tablets today, then 1 tablet daily for the next 4 days.  Continue supportive care  I have refilled the rest of your medications as well  Follow-up as scheduled with me in April, sooner if needed

## 2024-03-24 ENCOUNTER — Encounter (HOSPITAL_COMMUNITY): Payer: PRIVATE HEALTH INSURANCE | Admitting: Student in an Organized Health Care Education/Training Program

## 2024-03-24 NOTE — Progress Notes (Deleted)
 [facility] [address] [place] [address] Dept: [phone] Dept Fax: [phone]  Psychotherapy Progress Note  Virtual Visit via Video Note  I connected with [name] on 03/24/24 at 11:00 AM EDT by a video enabled telemedicine application and verified that I am speaking with the correct person using two identifiers.  Location: Patient: Home/walking around neighborhood Provider: [facility]   I discussed the limitations of evaluation and management by telemedicine and the availability of in person appointments. The patient expressed understanding and agreed to proceed.   Patient ID: [name], female  DOB: Jun 24, 1971, 53 y.o.  MRN: [id number]  03/24/2024 Start time: 11:00 AM End time: 11:46 AM  Method of Visit: Face-to-Face  Present: patient  Current Concerns:  [name] is a 53 yr old female who presents to establish care for therapy. PPHx is significant Depression, 1 Suicide Attempt (OD on Tylenol 1980's), and 1 hospitalization (2011).   She reports ***   Current Symptoms: Anxiety and Depressed Mood *** Psychiatric Specialty Exam: General Appearance: Casual and Fairly Groomed  Eye Contact:  Good  Speech:  Clear and Coherent and Normal Rate  Volume:  Normal  Mood:  Anxious and Dysphoric  Affect:  Congruent  Thought Process:  Coherent and Goal Directed  Orientation:  Full (Time, Place, and Person)  Thought Content:  Logical and Hallucinations: Auditory  Suicidal Thoughts:  No  Homicidal Thoughts:  No  Memory:  Immediate;   Good Recent;   Good  Judgement:  Good  Insight:  Good  Psychomotor Activity:  Normal  Concentration:  Concentration: Good and Attention Span: Good  Recall:  Good  Fund of Knowledge:Good  Language: Good  Akathisia:  Negative  Handed:  Left  AIMS (if indicated):  not done  Assets:  Communication Skills Desire for Improvement Resilience  ADL's:  Intact  Cognition: WNL   Sleep:  Fair     Diagnosis: MDD, GAD, PTSD, Auditory Hallucinations  Anticipated Frequency of Visits: Every 4 weeks Anticipated Length of Treatment Episode: 12-16  Short Term Goals/Goals for Treatment Session: Work on coping skills *** Progress Towards Goals: Initial  Treatment Intervention: Supportive therapy  Medical Necessity: Prevented onset or worsening of patient condition  Assessment Tools:    06/22/2022    3:02 PM  Depression screen PHQ 2/9  Decreased Interest 1  Down, Depressed, Hopeless 1  PHQ - 2 Score 2  Altered sleeping 1  Tired, decreased energy 1  Change in appetite 1  Feeling bad or failure about yourself  1  Trouble concentrating 1  Moving slowly or fidgety/restless 0  Suicidal thoughts 0  PHQ-9 Score 7   Failed to redirect to the Timeline version of the REVFS SmartLink. Flowsheet Row ED from 01/31/2024 in [facility] Emergency Department at [facility] ED from 11/23/2023 in [facility] Department at [facility] ED from 03/29/2023 in [facility] Department at [facility]  C-SSRS RISK CATEGORY No Risk No Risk No Risk       Collaboration of Care:   Patient/Guardian was advised Release of Information must be obtained prior to any record release in order to collaborate their care with an outside provider. Patient/Guardian was advised if they have not already done so to contact the registration department to sign all necessary forms in order for [place] to release information regarding their care.   Consent: Patient/Guardian gives verbal consent for treatment and assignment of benefits for services provided during  this visit. Patient/Guardian expressed understanding and agreed to proceed.   Plan: Provided talk/Supportive therapy.  She continues ***     MDD, Recurrent, Severe  GAD  PTSD  Auditory Hallucinations (r/o Post Ictal Psychosis): -Continue Abilify 2 mg daily for depression, anxiety, and hallucinations.   No refills sent at this time.   Lauro Franklin, MD 03/24/2024     Follow Up Instructions:    I discussed the assessment and treatment plan with the patient. The patient was provided an opportunity to ask questions and all were answered. The patient agreed with the plan and demonstrated an understanding of the instructions.   The patient was advised to call back or seek an in-person evaluation if the symptoms worsen or if the condition fails to improve as anticipated.  I provided *** minutes of non-face-to-face time during this encounter.   Lauro Franklin, MD

## 2024-03-24 NOTE — Progress Notes (Signed)
 This encounter was created in error - please disregard.

## 2024-03-27 ENCOUNTER — Ambulatory Visit: Admitting: Family Medicine

## 2024-04-03 ENCOUNTER — Ambulatory Visit: Payer: Medicaid Other | Admitting: Neurology

## 2024-04-03 ENCOUNTER — Encounter: Payer: Self-pay | Admitting: Neurology

## 2024-04-03 VITALS — BP 130/86 | HR 88 | Ht 63.0 in | Wt 173.0 lb

## 2024-04-03 DIAGNOSIS — Z5181 Encounter for therapeutic drug level monitoring: Secondary | ICD-10-CM | POA: Diagnosis not present

## 2024-04-03 DIAGNOSIS — R569 Unspecified convulsions: Secondary | ICD-10-CM | POA: Diagnosis not present

## 2024-04-03 MED ORDER — LAMOTRIGINE 100 MG PO TABS: 100.0000 mg | ORAL_TABLET | Freq: Every day | ORAL | 3 refills | Status: AC
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
## 2024-04-03 NOTE — Patient Instructions (Signed)
 Continue with Lamotrigine   Continue your other medications We will check lamotrigine  level Continue to follow-up with Dr. Return as needed.

## 2024-04-04 ENCOUNTER — Encounter: Payer: Self-pay | Admitting: Family Medicine

## 2024-04-04 ENCOUNTER — Ambulatory Visit: Admitting: Family Medicine

## 2024-04-04 VITALS — BP 160/100 | HR 74 | Temp 97.6°F | Ht 63.0 in | Wt 173.0 lb

## 2024-04-04 DIAGNOSIS — B9689 Other specified bacterial agents as the cause of diseases classified elsewhere: Secondary | ICD-10-CM

## 2024-04-04 DIAGNOSIS — F1090 Alcohol use, unspecified, uncomplicated: Secondary | ICD-10-CM

## 2024-04-04 DIAGNOSIS — F331 Major depressive disorder, recurrent, moderate: Secondary | ICD-10-CM | POA: Insufficient documentation

## 2024-04-04 DIAGNOSIS — F411 Generalized anxiety disorder: Secondary | ICD-10-CM

## 2024-04-04 DIAGNOSIS — I10 Essential (primary) hypertension: Secondary | ICD-10-CM

## 2024-04-04 DIAGNOSIS — J302 Other seasonal allergic rhinitis: Secondary | ICD-10-CM

## 2024-04-04 DIAGNOSIS — Z79899 Other long term (current) drug therapy: Secondary | ICD-10-CM | POA: Diagnosis not present

## 2024-04-04 DIAGNOSIS — J069 Acute upper respiratory infection, unspecified: Secondary | ICD-10-CM

## 2024-04-04 MED ORDER — AMOXICILLIN-POT CLAVULANATE 875-125 MG PO TABS: 1.0000 | ORAL_TABLET | Freq: Two times a day (BID) | ORAL | 0 refills | Status: DC

## 2024-04-04 MED ORDER — CETIRIZINE HCL 10 MG PO TABS: 10.0000 mg | ORAL_TABLET | Freq: Every day | ORAL | 11 refills | Status: AC

## 2024-04-04 MED ORDER — HYDROCHLOROTHIAZIDE 25 MG PO TABS: 25.0000 mg | ORAL_TABLET | Freq: Every day | ORAL | 1 refills | Status: AC

## 2024-04-04 NOTE — Assessment & Plan Note (Signed)
 Cont abilify , zoloft . F/u psych as scheduled

## 2024-04-04 NOTE — Assessment & Plan Note (Signed)
Cont current regimen

## 2024-04-04 NOTE — Progress Notes (Signed)
 Established Patient Office Visit  Subjective   Patient ID: [name], female    DOB: [birth year]  Age: 53 y.o. MRN: [id number]  Chief Complaint  Patient presents with   Follow-up   Nasal Congestion    Allergies bothering her, no allergy medications. Eyes puffy, watery, crusty    HPI Patient presents today for medication management. Reports compliance with medication regimen. Will need hydrochlorothiazide  refill. Not fasting today. Reports increased eye watering, productive cough, nasal congestion.  Was treated for URI with azithromycin  on 03/10/24. States that she did feel a little better while taking this, but the cough never resolved, and is worse now. Denies sinus pain, pressure, ear pain, fever, chills, other symptoms. Has not attempted OTC treatment. Denies known sick contacts. Medical history as outlined below.  Inquiring about program to help pay for rx medications.  ROS Per HPI    Objective:     BP (!) 160/100 (BP Location: Left Arm, Patient Position: Sitting)   Pulse 74   Temp 97.6 F (36.4 C) (Temporal)   Ht 5\' 3"  (1.6 m)   Wt 173 lb (78.5 kg)   SpO2 95%   BMI 30.65 kg/m   Physical Exam Vitals and nursing note reviewed.  Constitutional:      General: She is not in acute distress.    Appearance: Normal appearance. She is ill-appearing.  HENT:     Head: Normocephalic and atraumatic.     Right Ear: External ear normal.     Left Ear: External ear normal.     Nose: Congestion and rhinorrhea present.     Mouth/Throat:     Mouth: Mucous membranes are moist.     Pharynx: Oropharynx is clear.     Comments: Oropharyngeal cobblestoning   Eyes:     General:        Right eye: Discharge present.        Left eye: Discharge present.    Extraocular Movements: Extraocular movements intact.     Pupils: Pupils are equal, round, and reactive to light.     Comments: Both eyes persistently watering, clear fluid  Cardiovascular:     Rate and Rhythm: Normal  rate and regular rhythm.     Pulses: Normal pulses.     Heart sounds: Murmur heard.  Pulmonary:     Effort: Pulmonary effort is normal. No respiratory distress.     Breath sounds: Normal breath sounds. No wheezing, rhonchi or rales.  Musculoskeletal:        General: Normal range of motion.     Cervical back: Normal range of motion.     Right lower leg: No edema.     Left lower leg: No edema.  Lymphadenopathy:     Cervical: No cervical adenopathy.  Neurological:     General: No focal deficit present.     Mental Status: She is alert and oriented to person, place, and time.  Psychiatric:        Mood and Affect: Mood normal.        Thought Content: Thought content normal.     No results found for any visits on 04/04/24.   The ASCVD Risk score (Arnett DK, et al., 2019) failed to calculate for the following reasons:   Cannot find a previous HDL lab   Cannot find a previous total cholesterol lab    Assessment & Plan:   Primary hypertension Assessment & Plan: Elevated today, likley r/t illness Cont HCTZ  Orders: -  hydroCHLOROthiazide ; Take 1 tablet (25 mg total) by mouth daily.  Dispense: 90 tablet; Refill: 1 -     AMB Referral [facility] Care Management  Medication management Assessment & Plan: Cont current regimen  Orders: -     Cetirizine  HCl; Take 1 tablet (10 mg total) by mouth daily.  Dispense: 30 tablet; Refill: 11 -     Amoxicillin -Pot Clavulanate; Take 1 tablet by mouth 2 (two) times daily.  Dispense: 20 tablet; Refill: 0 -     AMB Referral [facility] Care Management  Alcohol use disorder Assessment & Plan: F/u psych as scheduled, counseling as scheduled  Orders: -     AMB Referral [facility] Care Management  MDD (major depressive disorder), recurrent episode, moderate (HCC) Assessment & Plan: F/u psych as scheduled, cont abilify , zoloft   Orders: -     AMB Referral [facility] Care Management  GAD (generalized anxiety disorder) Assessment & Plan: Cont abilify , zoloft .  F/u psych as scheduled  Orders: -     AMB Referral [facility] Care Management  Bacterial URI -     Amoxicillin -Pot Clavulanate; Take 1 tablet by mouth 2 (two) times daily.  Dispense: 20 tablet; Refill: 0 -     AMB Referral [facility]I Care Management  Seasonal allergies -     Cetirizine  HCl; Take 1 tablet (10 mg total) by mouth daily.  Dispense: 30 tablet; Refill: 11 -     AMB Referral [facility]BCI Care Management     Return in about 6 months (around 10/04/2024) for med mgt.    Wellington Half, FNP

## 2024-04-04 NOTE — Assessment & Plan Note (Signed)
 Elevated today, likley r/t illness Cont HCTZ

## 2024-04-04 NOTE — Patient Instructions (Signed)
 I have sent in Augmentin  for you to take twice a day for 10 days.  This medication can upset your stomach, so I tell everyone to take it with a meal.  I have sent in zyrtec  for you to take once daily to help with allergies.  Continue current medication regimen.  Follow up with me in 6 mos for medication management.

## 2024-04-04 NOTE — Assessment & Plan Note (Signed)
 F/u psych as scheduled, counseling as scheduled

## 2024-04-04 NOTE — Assessment & Plan Note (Signed)
 F/u psych as scheduled, cont abilify , zoloft 

## 2024-04-05 ENCOUNTER — Telehealth: Payer: Self-pay | Admitting: *Deleted

## 2024-04-05 LAB — LAMOTRIGINE LEVEL: Lamotrigine Lvl: <1 ug/mL — ABNORMAL LOW (ref 2.0–20.0)

## 2024-04-05 NOTE — Progress Notes (Unsigned)
 Complex Care Management Note Care Guide Note  04/05/2024 Name: [name] MRN: [id number] DOB: 1971/02/15   Complex Care Management Outreach Attempts: An unsuccessful telephone outreach was attempted today to offer the patient information about available complex care management services.  Follow Up Plan:  Additional outreach attempts will be made to offer the patient complex care management information and services.   Encounter Outcome:  No Answer  Kandis Ormond, CMA [place]  [facility], [facility] Guide Direct Dial: [phone]  Fax: [phone] Website: [email or web address]

## 2024-04-06 ENCOUNTER — Encounter: Payer: Self-pay | Admitting: Neurology

## 2024-04-06 NOTE — Progress Notes (Signed)
 Complex Care Management Note  Care Guide Note 04/06/2024 Name: [name] MRN: [id number] DOB: 06-Apr-1971  [name] is a 53 y.o. year old female who sees Wellington Half, FNP for primary care. I reached out to [name] by phone today to offer complex care management services.  Ms. Waxman was given information about Complex Care Management services today including:   The Complex Care Management services include support from the care team which includes your Nurse Care Manager, Clinical Social Worker, or Pharmacist.  The Complex Care Management team is here to help remove barriers to the health concerns and goals most important to you. Complex Care Management services are voluntary, and the patient may decline or stop services at any time by request to their care team member.   Complex Care Management Consent Status: Patient agreed to services and verbal consent obtained.   Follow up plan:  Telephone appointment with complex care management team member scheduled for:  04/20/2024 and 04/21/2024  Encounter Outcome:  Patient Scheduled  Kandis Ormond, CMA [place]  [facility], [facility] Guide Direct Dial: [phone]  Fax: [phone] Website: [email or web address]

## 2024-04-12 ENCOUNTER — Telehealth: Payer: Self-pay | Admitting: Neurology

## 2024-04-12 NOTE — Telephone Encounter (Addendum)
 Pt has called to clarify the orders for how she is to take lamoTRIgine  (LAMICTAL ) 100 MG tablet , please call

## 2024-04-12 NOTE — Telephone Encounter (Signed)
 I called pt and wanted to confirm that she was taking lamotrigine  100mg  po daily.  She said she was, but wanted to know if she needs to take BID as in mychart note or once daily.  I called her pharmacy listed  [facility] [address]  Filled #90 09-20-2023,  2 other prescriptions 03-10-2024 and 04-03-2024 on hold.

## 2024-04-13 NOTE — Telephone Encounter (Signed)
 It should be daily. Thanks

## 2024-04-13 NOTE — Telephone Encounter (Signed)
 I called pt back and relayed that she needs to take lamotrigine  100mg  po daily.  She then stated to me that she has been having jerking/in her legs at night, more frequently since on lamotrigine .  She did not mention this at her appt.  She say starts to fall asleep then her legs will start to shake lasting several seconds, that she has to get up.  Increased frequency.  I asked if this is like her seizures that she has had previously and she said no.  I relayed that her lamotrigine  level was very low, but she said that she had old bottles and was taking.  She will pick up new bottle on Friday.  (100mg  tabs).  I told her that he may want her to take and see how she does since her level was so low, and then let us  know.  But would confirm and call her back.

## 2024-04-20 ENCOUNTER — Telehealth: Payer: Self-pay

## 2024-04-20 NOTE — Patient Outreach (Signed)
 Complex Care Management   Visit Note  04/20/2024  Name:  [name] MRN: [id number] DOB: [birth year]  Situation: RNCM called to complete assessment. Patient reports she is on her way out the door and request to reschedule for tomorrow.   Follow Up Plan:  RNCM rescheduled telephone assessment for 04/21/24 at 3:00 pm  Lindi Revering, RN, MSN, BSN, CCM [place]  [facility], Population Health Case Manager Phone: [phone]

## 2024-04-21 ENCOUNTER — Telehealth: Payer: Self-pay | Admitting: Pharmacist

## 2024-04-21 ENCOUNTER — Other Ambulatory Visit

## 2024-04-21 ENCOUNTER — Telehealth: Payer: Self-pay

## 2024-04-21 NOTE — Telephone Encounter (Signed)
 Attempted to reach patient for pharmacist telephone appt. No answer x2, left voicemail  Rainelle Bur, PharmD, BCPS, CPP Clinical Pharmacist Practitioner [facility] [phone]

## 2024-04-24 ENCOUNTER — Telehealth: Payer: Self-pay

## 2024-04-27 ENCOUNTER — Telehealth: Payer: Self-pay | Admitting: Neurology

## 2024-04-27 ENCOUNTER — Telehealth (HOSPITAL_COMMUNITY): Payer: Self-pay | Admitting: Student in an Organized Health Care Education/Training Program

## 2024-04-27 ENCOUNTER — Telehealth: Payer: Self-pay | Admitting: Family Medicine

## 2024-04-27 MED ORDER — VALTOCO 20 MG DOSE 2 X 10 MG/0.1ML NA LQPK: NASAL | 3 refills | Status: AC

## 2024-04-27 NOTE — Telephone Encounter (Signed)
 Call to patient, she reports having a GTC seizure on 5/13 and 5/14. She does not have a rescue medication and she did not go to the ER. Paramedics did come and  assess her and told her likely the antibiotic cause her seizure. She reports having a recent sinus infection and taking Augmentin . She did not pick up the day it was prescribed and has been taking up until 5/1 and then stopped because of paramedics stating it was likely cause the antibiotic that caused it. She reports medication compliance. She does report alcohol use, increased stress, and sleep deprivation. I reviewed [place] driving laws and seizure triggers and seizure safety measures. Advised I would send to Dr. Samara Crest to review.

## 2024-04-27 NOTE — Telephone Encounter (Signed)
 Spoke with DOD, she mentions this is normally not a side effect of augmentin . She suggest scheduling patient for a F/U, and a possible neurology referral. Willing to switch medication if patient prefers, if she has another seizure present to the ED per DOD. I reached out to patient, briefly reviewed her chart. She has a history of seizures, she follows with Neuro already. She reached out to them as well, patient states she started the antibiotic on 05/01, and she has taken this medication in the past and had no issues. She would like to finish the medication, as she wants to feel better. She also mentions, she is compliant with her seizure medication, but has also been under a lot of stress. She states she will wait until Dr.Camara replies and see what he suggest. Advised patient to call us  back if anything changes, if she needs anything else.  Sending to DOD & PCP for FYI.

## 2024-04-27 NOTE — Telephone Encounter (Signed)
 Pt called stating that she has has 2 seizure back to back . The first one  was  on 04-25-2024  and the second one was on 04-26-2024. Pt informed that paramedic came to home and calmed her down . Paramedic instructed her to call Doctor office to follow up. Pt believe it happen because of the Antibiotic she is taking . Pt states she need to speak with nurse or Provider

## 2024-04-27 NOTE — Telephone Encounter (Signed)
 Copied from CRM [id number]. Topic: Clinical - Prescription Issue >> Apr 27, 2024 12:55 PM Adonis Hoot wrote: Reason for CRM: Patient called in stating that she has had 2 seizures the last 2 nights,where 911 was called however she did not got to ED.However they advised her that it may be the amoxicillin -clavulanate (AUGMENTIN ) 875-125 MG tablet that's causing them. Patient stated that she needs the antibiotics for her sinus infection that she has. She would like to know what should she do in the mean time?

## 2024-04-27 NOTE — Telephone Encounter (Signed)
 Patient would like for you to give her a call ASAP. Thanks!

## 2024-05-12 ENCOUNTER — Ambulatory Visit: Admitting: Family Medicine

## 2024-05-16 ENCOUNTER — Other Ambulatory Visit: Payer: Self-pay | Admitting: Family Medicine

## 2024-05-16 ENCOUNTER — Emergency Department (HOSPITAL_COMMUNITY)

## 2024-05-16 ENCOUNTER — Emergency Department (HOSPITAL_COMMUNITY)
Admission: EM | Admit: 2024-05-16 | Discharge: 2024-05-16 | Disposition: A | Discharge status: Home / Self Care | Attending: Emergency Medicine | Admitting: Emergency Medicine

## 2024-05-16 ENCOUNTER — Encounter (HOSPITAL_COMMUNITY): Payer: Self-pay

## 2024-05-16 ENCOUNTER — Other Ambulatory Visit: Payer: Self-pay

## 2024-05-16 DIAGNOSIS — R569 Unspecified convulsions: Secondary | ICD-10-CM | POA: Diagnosis present

## 2024-05-16 DIAGNOSIS — W19XXXA Unspecified fall, initial encounter: Secondary | ICD-10-CM | POA: Diagnosis not present

## 2024-05-16 DIAGNOSIS — E876 Hypokalemia: Secondary | ICD-10-CM

## 2024-05-16 LAB — RAPID URINE DRUG SCREEN, HOSP PERFORMED
Amphetamines: NOT DETECTED
Barbiturates: NOT DETECTED
Benzodiazepines: NOT DETECTED
Cocaine: NOT DETECTED
Opiates: NOT DETECTED
Tetrahydrocannabinol: POSITIVE — AB

## 2024-05-16 LAB — CBC WITH DIFFERENTIAL/PLATELET
Abs Immature Granulocytes: 0.02 10*3/uL (ref 0.00–0.07)
Basophils Absolute: 0 10*3/uL (ref 0.0–0.1)
Basophils Relative: 0 %
Eosinophils Absolute: 0 10*3/uL (ref 0.0–0.5)
Eosinophils Relative: 1 %
HCT: 31.6 % — ABNORMAL LOW (ref 36.0–46.0)
Hemoglobin: 9.6 g/dL — ABNORMAL LOW (ref 12.0–15.0)
Immature Granulocytes: 0 %
Lymphocytes Relative: 28 %
Lymphs Abs: 1.8 10*3/uL (ref 0.7–4.0)
MCH: 23.6 pg — ABNORMAL LOW (ref 26.0–34.0)
MCHC: 30.4 g/dL (ref 30.0–36.0)
MCV: 77.6 fL — ABNORMAL LOW (ref 80.0–100.0)
Monocytes Absolute: 0.4 10*3/uL (ref 0.1–1.0)
Monocytes Relative: 6 %
Neutro Abs: 4.1 10*3/uL (ref 1.7–7.7)
Neutrophils Relative %: 65 %
Platelets: 437 10*3/uL — ABNORMAL HIGH (ref 150–400)
RBC: 4.07 MIL/uL (ref 3.87–5.11)
RDW: 21.1 % — ABNORMAL HIGH (ref 11.5–15.5)
WBC: 6.3 10*3/uL (ref 4.0–10.5)
nRBC: 0 % (ref 0.0–0.2)

## 2024-05-16 LAB — COMPREHENSIVE METABOLIC PANEL WITH GFR
ALT: 16 U/L (ref 0–44)
AST: 25 U/L (ref 15–41)
Albumin: 3.9 g/dL (ref 3.5–5.0)
Alkaline Phosphatase: 57 U/L (ref 38–126)
Anion gap: 11 (ref 5–15)
BUN: 12 mg/dL (ref 6–20)
CO2: 19 mmol/L — ABNORMAL LOW (ref 22–32)
Calcium: 8.6 mg/dL — ABNORMAL LOW (ref 8.9–10.3)
Chloride: 101 mmol/L (ref 98–111)
Creatinine, Ser: 1.19 mg/dL — ABNORMAL HIGH (ref 0.44–1.00)
GFR, Estimated: 56 mL/min — ABNORMAL LOW (ref >60)
Glucose, Bld: 108 mg/dL — ABNORMAL HIGH (ref 70–99)
Potassium: 3.1 mmol/L — ABNORMAL LOW (ref 3.5–5.1)
Sodium: 131 mmol/L — ABNORMAL LOW (ref 135–145)
Total Bilirubin: 0.5 mg/dL (ref 0.0–1.2)
Total Protein: 8.2 g/dL — ABNORMAL HIGH (ref 6.5–8.1)

## 2024-05-16 LAB — PREGNANCY, URINE: Preg Test, Ur: NEGATIVE

## 2024-05-16 MED ORDER — ACETAMINOPHEN 325 MG PO TABS
650.0000 mg | ORAL_TABLET | Freq: Once | ORAL | Status: AC
  Administered 2024-05-16: 650 mg via ORAL
  Filled 2024-05-16: qty 2

## 2024-05-16 MED ORDER — SODIUM CHLORIDE 0.9 % IV BOLUS
500.0000 mL | Freq: Once | INTRAVENOUS | Status: AC
  Administered 2024-05-16: 500 mL via INTRAVENOUS

## 2024-05-16 MED ORDER — ONDANSETRON HCL 4 MG/2ML IJ SOLN
4.0000 mg | Freq: Once | INTRAMUSCULAR | Status: AC
  Administered 2024-05-16: 4 mg via INTRAVENOUS
  Filled 2024-05-16: qty 2

## 2024-05-16 MED ORDER — LEVETIRACETAM (KEPPRA) 500 MG/5 ML ADULT IV PUSH
1000.0000 mg | Freq: Once | INTRAVENOUS | Status: AC
  Administered 2024-05-16: 1000 mg via INTRAVENOUS
  Filled 2024-05-16: qty 10

## 2024-05-16 MED ORDER — POTASSIUM CHLORIDE CRYS ER 20 MEQ PO TBCR: 20.0000 meq | EXTENDED_RELEASE_TABLET | Freq: Two times a day (BID) | ORAL | 0 refills | Status: AC

## 2024-05-16 MED ORDER — NALOXONE HCL 2 MG/2ML IJ SOSY
2.0000 mg | PREFILLED_SYRINGE | Freq: Once | INTRAMUSCULAR | Status: AC
  Administered 2024-05-16: 2 mg via INTRAVENOUS
  Filled 2024-05-16: qty 2

## 2024-05-16 NOTE — ED Notes (Signed)
 Meal tray given

## 2024-05-16 NOTE — ED Triage Notes (Signed)
 BIB EMS from home, family reports pt was found on the ground, hx of seizure. After seizure family gave 20mg  of IN ativan . Per EMS pt has been altered since they got there. Family reports ETOH on board. Pt is hypotensive. CBG 144. 22 g in R hand established.

## 2024-05-16 NOTE — ED Notes (Signed)
 Pt is currently on her cycle

## 2024-05-16 NOTE — ED Provider Notes (Signed)
 Patient signed to me by Dr. Elby Green pending patient metabolizing her Ativan .  Patient watched here and she had no evidence of desaturations.  She is able to eat a meal by herself.  She is ready go home at this time   Lind Repine, MD 05/16/24 1145

## 2024-05-16 NOTE — ED Provider Notes (Signed)
 [place] EMERGENCY DEPARTMENT AT [facility] Provider Note   CSN: [id number] Arrival date & time: 05/16/24  4098     History  Chief Complaint  Patient presents with   Seizures    [name] is a 53 y.o. female.  The history is provided by the patient.  Seizures Seizure activity on arrival: no   Seizure type:  Unable to specify (family heard a family, nothing witnessed but given 20 mg IN ativan ) Preceding symptoms: no sensation of an aura present   Initial focality:  None Episode characteristics: no abnormal movements   Return to baseline: no   Context: medical compliance and not possible hypoglycemia   PTA treatment:  Lorazepam  History of seizures: yes   Patient with seizure history presents who was drinking ETOH while cooking had an unwitnessed fall that fall thought may have been a seizure so they gave 20 mg of IN ativan  and called EMS.       Home Medications Prior to Admission medications   Medication Sig Start Date End Date Taking? Authorizing Provider  amoxicillin -clavulanate (AUGMENTIN ) 875-125 MG tablet Take 1 tablet by mouth 2 (two) times daily. 04/04/24   Wellington Half, FNP  ARIPiprazole  (ABILIFY ) 2 MG tablet Take 1 tablet (2 mg total) by mouth daily. 03/10/24   Wellington Half, FNP  cetirizine  (ZYRTEC ) 10 MG tablet Take 1 tablet (10 mg total) by mouth daily. 04/04/24   Wellington Half, FNP  diazePAM , 20 MG Dose, (VALTOCO  20 MG DOSE) 2 x 10 MG/0.1ML LQPK Spray 1 vial (10mg ) into each nostril at onset of GTC seizure (convulsions)  and call 911 04/27/24   Camara, Amadou, MD  hydrochlorothiazide  (HYDRODIURIL ) 25 MG tablet Take 1 tablet (25 mg total) by mouth daily. 04/04/24 07/03/24  Wellington Half, FNP  ibuprofen  (ADVIL ,MOTRIN ) 600 MG tablet Take 1 tablet (600 mg total) by mouth every 6 (six) hours as needed. 02/21/19   Ethlyn Herd, MD  lamoTRIgine  (LAMICTAL ) 100 MG tablet Take 1 tablet (100 mg total) by mouth daily. 04/03/24  03/29/25  Camara, Amadou, MD  sertraline  (ZOLOFT ) 50 MG tablet Take 1 tablet (50 mg total) by mouth daily. Take a half tablet (25 mg) for 14 days then take a whole tablet (50 mg) daily 03/10/24   Wellington Half, FNP      Allergies    Patient has no known allergies.    Review of Systems   Review of Systems  Unable to perform ROS: Acuity of condition  Constitutional:  Negative for fever.  Gastrointestinal:  Negative for vomiting.    Physical Exam Updated Vital Signs BP 122/63   Pulse 80   Temp (!) 97.5 F (36.4 C) (Oral)   Resp 18   Ht 5\' 3"  (1.6 m)   Wt 78.5 kg   SpO2 100%   BMI 30.65 kg/m  Physical Exam Vitals and nursing note reviewed.  Constitutional:      General: She is not in acute distress.    Appearance: She is well-developed.     Comments: Somnolent   HENT:     Head: Normocephalic and atraumatic.     Nose: Nose normal.     Mouth/Throat:     Mouth: Mucous membranes are moist.  Eyes:     Pupils: Pupils are equal, round, and reactive to light.  Cardiovascular:     Rate and Rhythm: Normal rate and regular rhythm.     Pulses: Normal pulses.     Heart sounds: Normal  heart sounds.  Pulmonary:     Effort: Pulmonary effort is normal. No respiratory distress.     Breath sounds: Normal breath sounds.  Abdominal:     General: Bowel sounds are normal. There is no distension.     Palpations: Abdomen is soft.     Tenderness: There is no abdominal tenderness. There is no guarding or rebound.  Musculoskeletal:        General: Normal range of motion.     Cervical back: Normal range of motion and neck supple.  Skin:    General: Skin is warm and dry.     Capillary Refill: Capillary refill takes less than 2 seconds.     Findings: No erythema or rash.  Neurological:     Deep Tendon Reflexes: Reflexes normal.  Psychiatric:        Mood and Affect: Mood normal.     ED Results / Procedures / Treatments   Labs (all labs ordered are listed, but only abnormal results  are displayed) Results for orders placed or performed during the hospital encounter of 05/16/24  CBC with Differential   Collection Time: 05/16/24  2:35 AM  Result Value Ref Range   WBC 6.3 4.0 - 10.5 K/uL   RBC 4.07 3.87 - 5.11 MIL/uL   Hemoglobin 9.6 (L) 12.0 - 15.0 g/dL   HCT 09.8 (L) 11.9 - 14.7 %   MCV 77.6 (L) 80.0 - 100.0 fL   MCH 23.6 (L) 26.0 - 34.0 pg   MCHC 30.4 30.0 - 36.0 g/dL   RDW 82.9 (H) 56.2 - 13.0 %   Platelets 437 (H) 150 - 400 K/uL   nRBC 0.0 0.0 - 0.2 %   Neutrophils Relative % 65 %   Neutro Abs 4.1 1.7 - 7.7 K/uL   Lymphocytes Relative 28 %   Lymphs Abs 1.8 0.7 - 4.0 K/uL   Monocytes Relative 6 %   Monocytes Absolute 0.4 0.1 - 1.0 K/uL   Eosinophils Relative 1 %   Eosinophils Absolute 0.0 0.0 - 0.5 K/uL   Basophils Relative 0 %   Basophils Absolute 0.0 0.0 - 0.1 K/uL   Immature Granulocytes 0 %   Abs Immature Granulocytes 0.02 0.00 - 0.07 K/uL   Giant PLTs PRESENT   Comprehensive metabolic panel   Collection Time: 05/16/24  2:35 AM  Result Value Ref Range   Sodium 131 (L) 135 - 145 mmol/L   Potassium 3.1 (L) 3.5 - 5.1 mmol/L   Chloride 101 98 - 111 mmol/L   CO2 19 (L) 22 - 32 mmol/L   Glucose, Bld 108 (H) 70 - 99 mg/dL   BUN 12 6 - 20 mg/dL   Creatinine, Ser 8.65 (H) 0.44 - 1.00 mg/dL   Calcium 8.6 (L) 8.9 - 10.3 mg/dL   Total Protein 8.2 (H) 6.5 - 8.1 g/dL   Albumin 3.9 3.5 - 5.0 g/dL   AST 25 15 - 41 U/L   ALT 16 0 - 44 U/L   Alkaline Phosphatase 57 38 - 126 U/L   Total Bilirubin 0.5 0.0 - 1.2 mg/dL   GFR, Estimated 56 (L) >60 mL/min   Anion gap 11 5 - 15  Rapid urine drug screen (hospital performed)   Collection Time: 05/16/24  2:56 AM  Result Value Ref Range   Opiates NONE DETECTED NONE DETECTED   Cocaine NONE DETECTED NONE DETECTED   Benzodiazepines NONE DETECTED NONE DETECTED   Amphetamines NONE DETECTED NONE DETECTED   Tetrahydrocannabinol POSITIVE (A) NONE DETECTED  Barbiturates NONE DETECTED NONE DETECTED  Pregnancy, urine    Collection Time: 05/16/24  2:56 AM  Result Value Ref Range   Preg Test, Ur NEGATIVE NEGATIVE   No results found.  EKG EKG Interpretation Date/Time:  Tuesday May 16 2024 02:49:46 EDT Ventricular Rate:  74 PR Interval:  183 QRS Duration:  98 QT Interval:  443 QTC Calculation: 492 R Axis:   46  Text Interpretation: Sinus rhythm Confirmed by Maralee Senate, Kamyrah Feeser ([id number]) on 05/16/2024 3:32:55 AM  Radiology No results found.  Procedures Procedures    Medications Ordered in ED Medications  naloxone [facility]) injection 2 mg (2 mg Intravenous Incomplete 05/16/24 0558)  levETIRAcetam (KEPPRA) undiluted injection 1,000 mg (1,000 mg Intravenous Given 05/16/24 0258)  sodium chloride  0.9 % bolus 500 mL (0 mLs Intravenous Stopped 05/16/24 0457)  ondansetron  (ZOFRAN ) injection 4 mg (4 mg Intravenous Given 05/16/24 0504)    ED Course/ Medical Decision Making/ A&P                                 Medical Decision Making Patient fell unwitnessed thought by family to have seizure and given 20 mg IN ativan , did have ETOH and marijuana   Amount and/or Complexity of Data Reviewed Independent Historian: EMS    Details: See above  External Data Reviewed: notes.    Details: Previous notes reviewed  Labs: ordered.    Details: UDS positive for marijuana. Normal white count 6.3, low hemoglobin 9.6, normal platelets.  Sodium slight low 313, potassium slight low 3.1, creatinine slight 1.19  Radiology: ordered and independent interpretation performed.    Details: Negative head CT ECG/medicine tests: ordered and independent interpretation performed. Decision-making details documented in ED Course.  Risk Prescription drug management. Risk Details: Attempted narcan to arouse patient. This did not help. Patient is arousable to verbal stimuli but immediately falls back to sleep.  Will need to be alert and PO challenge prior to discharge.      Final Clinical Impression(s) / ED Diagnoses Final diagnoses:  None    Signed out to Dr. Leighton Punches, pending awakening.   Rx / DC Orders ED Discharge Orders     None         Takeria Marquina, MD 05/16/24 [phone]

## 2024-05-18 ENCOUNTER — Ambulatory Visit: Admitting: Family Medicine

## 2024-05-18 ENCOUNTER — Encounter: Payer: Self-pay | Admitting: Family Medicine

## 2024-05-18 ENCOUNTER — Ambulatory Visit: Payer: Self-pay | Admitting: Family Medicine

## 2024-05-18 VITALS — BP 138/86 | HR 84 | Temp 98.0°F | Ht 63.0 in | Wt 172.0 lb

## 2024-05-18 DIAGNOSIS — F1090 Alcohol use, unspecified, uncomplicated: Secondary | ICD-10-CM | POA: Diagnosis not present

## 2024-05-18 DIAGNOSIS — F411 Generalized anxiety disorder: Secondary | ICD-10-CM

## 2024-05-18 DIAGNOSIS — I10 Essential (primary) hypertension: Secondary | ICD-10-CM | POA: Diagnosis not present

## 2024-05-18 DIAGNOSIS — F1290 Cannabis use, unspecified, uncomplicated: Secondary | ICD-10-CM

## 2024-05-18 DIAGNOSIS — G40909 Epilepsy, unspecified, not intractable, without status epilepticus: Secondary | ICD-10-CM

## 2024-05-18 DIAGNOSIS — F331 Major depressive disorder, recurrent, moderate: Secondary | ICD-10-CM

## 2024-05-18 DIAGNOSIS — Z720 Tobacco use: Secondary | ICD-10-CM

## 2024-05-18 LAB — CBC WITH DIFFERENTIAL/PLATELET
Basophils Absolute: 0 10*3/uL (ref 0.0–0.1)
Basophils Relative: 0.4 % (ref 0.0–3.0)
Eosinophils Absolute: 0.1 10*3/uL (ref 0.0–0.7)
Eosinophils Relative: 1.5 % (ref 0.0–5.0)
HCT: 28.9 % — ABNORMAL LOW (ref 36.0–46.0)
Hemoglobin: 9.1 g/dL — ABNORMAL LOW (ref 12.0–15.0)
Lymphocytes Relative: 36.2 % (ref 12.0–46.0)
Lymphs Abs: 2 10*3/uL (ref 0.7–4.0)
MCHC: 31.5 g/dL (ref 30.0–36.0)
MCV: 74.7 fl — ABNORMAL LOW (ref 78.0–100.0)
Monocytes Absolute: 0.5 10*3/uL (ref 0.1–1.0)
Monocytes Relative: 9 % (ref 3.0–12.0)
Neutro Abs: 2.9 10*3/uL (ref 1.4–7.7)
Neutrophils Relative %: 52.9 % (ref 43.0–77.0)
Platelets: 402 10*3/uL — ABNORMAL HIGH (ref 150.0–400.0)
RBC: 3.88 Mil/uL (ref 3.87–5.11)
RDW: 21.5 % — ABNORMAL HIGH (ref 11.5–15.5)
WBC: 5.6 10*3/uL (ref 4.0–10.5)

## 2024-05-18 LAB — COMPREHENSIVE METABOLIC PANEL WITH GFR
ALT: 13 U/L (ref 0–35)
AST: 19 U/L (ref 0–37)
Albumin: 4.2 g/dL (ref 3.5–5.2)
Alkaline Phosphatase: 50 U/L (ref 39–117)
BUN: 12 mg/dL (ref 6–23)
CO2: 27 meq/L (ref 19–32)
Calcium: 9.8 mg/dL (ref 8.4–10.5)
Chloride: 101 meq/L (ref 96–112)
Creatinine, Ser: 0.77 mg/dL (ref 0.40–1.20)
GFR: 89.62 mL/min (ref >60.00)
Glucose, Bld: 70 mg/dL (ref 70–99)
Potassium: 4.1 meq/L (ref 3.5–5.1)
Sodium: 137 meq/L (ref 135–145)
Total Bilirubin: 0.2 mg/dL (ref 0.2–1.2)
Total Protein: 7.9 g/dL (ref 6.0–8.3)

## 2024-05-18 NOTE — Assessment & Plan Note (Signed)
 Better controlled today, continue HCTZ CBC, CMP

## 2024-05-18 NOTE — Assessment & Plan Note (Signed)
 Continue Abilify , sertraline  Follow-up with behavioral health on 06/09/2024 as scheduled

## 2024-05-18 NOTE — Assessment & Plan Note (Signed)
 Not ready to quit.

## 2024-05-18 NOTE — Progress Notes (Signed)
 Acute Office Visit  Subjective:     Patient ID: [name], female    DOB: [birth year], 53 y.o.   MRN: [id number]  Chief Complaint  Patient presents with   Hospitalization Follow-up    D/c 05/16/24 Had a sezuire this week /2 last week.     HPI Patient is in today for ED follow-up from 05/16/2024. She was seen in the ED for potential seizure and fall.  It was unwitnessed. Family member gave intranasal Ativan . Endorses alcohol and marijuana use at the time.  States she was cooking and then the next thing she knows she woke up with a headache. Reports compliance with medication regimen. Has follow-up with neurology in 6 months. Has follow-up with behavioral health on 06/09/2024. ED notes and results reviewed by me. Reports to generalized seizures last month on 04/25/2024, 04/26/2024.  Did not go to the ER, states she did not have rescue medications at home either. She is not currently driving. Denies lingering headache, other injury, other concerns today.  ROS Per HPI      Objective:    BP 138/86   Pulse 84   Temp 98 F (36.7 C) (Temporal)   Ht 5\' 3"  (1.6 m)   Wt 172 lb (78 kg)   SpO2 98%   BMI 30.47 kg/m    Physical Exam Vitals and nursing note reviewed.  Constitutional:      General: She is not in acute distress.    Appearance: Normal appearance.  HENT:     Head: Normocephalic and atraumatic.     Right Ear: External ear normal.     Left Ear: External ear normal.  Eyes:     Extraocular Movements: Extraocular movements intact.     Pupils: Pupils are equal, round, and reactive to light.  Cardiovascular:     Rate and Rhythm: Normal rate and regular rhythm.     Pulses: Normal pulses.     Heart sounds: Normal heart sounds.  Pulmonary:     Effort: Pulmonary effort is normal. No respiratory distress.     Breath sounds: Normal breath sounds. No wheezing, rhonchi or rales.  Musculoskeletal:        General: Normal range of motion.     Cervical back: Normal range of  motion.     Right lower leg: No edema.     Left lower leg: No edema.  Lymphadenopathy:     Cervical: No cervical adenopathy.  Neurological:     General: No focal deficit present.     Mental Status: She is alert and oriented to person, place, and time.  Psychiatric:        Attention and Perception: Attention normal.        Mood and Affect: Mood normal. Affect is tearful.        Speech: Speech normal.        Behavior: Behavior is withdrawn. Behavior is cooperative.        Thought Content: Thought content normal.        Cognition and Memory: Cognition and memory normal.        Judgment: Judgment normal.    No results found for any visits on 05/18/24.      Assessment & Plan:   Seizure disorder [facility]) Assessment & Plan: Discussed that low potassium and low sodium can also be seizure triggers Illness and infection can be a seizure trigger Note provided to be out of work until cleared by neurology Continue Lamictal  Continue rescue intranasal Valium  as  needed  Orders: -     Comprehensive metabolic panel with GFR  Primary hypertension Assessment & Plan: Better controlled today, continue HCTZ CBC, CMP  Orders: -     CBC with Differential/Platelet -     Comprehensive metabolic panel with GFR  MDD (major depressive disorder), recurrent episode, moderate (HCC) Assessment & Plan: Continue Abilify , sertraline  Follow-up with behavioral health on 06/09/2024 as scheduled   GAD (generalized anxiety disorder) Assessment & Plan: Follow-up behavioral health on 06/09/2024 as scheduled Continue Abilify  and sertraline    Marijuana use Assessment & Plan: Positive marijuana on UDS in ER on 05/16/2024   Alcohol use disorder Assessment & Plan: Discussed alcohol safety, making your own drinks CBC, CMP today  Orders: -     Comprehensive metabolic panel with GFR  Tobacco use Assessment & Plan: Not ready to quit      No orders of the defined types were placed in this  encounter.   Return in about 3 months (around 08/18/2024) for med mgt.  Wellington Half, FNP

## 2024-05-18 NOTE — Assessment & Plan Note (Addendum)
 Follow-up behavioral health on 06/09/2024 as scheduled Continue Abilify  and sertraline 

## 2024-05-18 NOTE — Assessment & Plan Note (Signed)
 Discussed that low potassium and low sodium can also be seizure triggers Illness and infection can be a seizure trigger Note provided to be out of work until cleared by neurology Continue Lamictal  Continue rescue intranasal Valium  as needed

## 2024-05-18 NOTE — Assessment & Plan Note (Signed)
 Discussed alcohol safety, making your own drinks CBC, CMP today

## 2024-05-18 NOTE — Assessment & Plan Note (Signed)
 Positive marijuana on UDS in ER on 05/16/2024

## 2024-05-22 ENCOUNTER — Telehealth: Payer: Self-pay | Admitting: *Deleted

## 2024-05-22 NOTE — Progress Notes (Unsigned)
 Complex Care Management Care Guide Note  05/22/2024 Name: [name] MRN: [id number] DOB: Jun 24, 1971  [name] is a 53 y.o. year old female who is a primary care patient of Wellington Half, FNP and is actively engaged with the care management team. I reached out to [name] by phone today to assist with re-scheduling  with the RN Case Manager.  Follow up plan: Unsuccessful telephone outreach attempt made. A HIPAA compliant phone message was left for the patient providing contact information and requesting a return call.  Kandis Ormond, CMA [place]  [facility], [facility] Guide Direct Dial: [phone]  Fax: [phone] Website: [email or web address]

## 2024-05-23 NOTE — Progress Notes (Signed)
 Complex Care Management Care Guide Note  05/23/2024 Name: [name] MRN: [id number] DOB: 09-12-1971  [name] is a 53 y.o. year old female who is a primary care patient of Wellington Half, FNP and is actively engaged with the care management team. I reached out to [name] by phone today to assist with re-scheduling  with the RN Case Manager.  Follow up plan: Unsuccessful telephone outreach attempt made. A HIPAA compliant phone message was left for the patient providing contact information and requesting a return call. No further outreach attempts will be made due to inability to maintain patient contact.   Kandis Ormond, CMA [place]  [facility], [facility] Guide Direct Dial: [phone]  Fax: [phone] Website: [email or web address]

## 2024-05-30 ENCOUNTER — Telehealth (HOSPITAL_COMMUNITY): Payer: Self-pay | Admitting: Student in an Organized Health Care Education/Training Program

## 2024-06-09 ENCOUNTER — Ambulatory Visit (HOSPITAL_COMMUNITY): Payer: PRIVATE HEALTH INSURANCE | Admitting: Student in an Organized Health Care Education/Training Program

## 2024-06-12 NOTE — Progress Notes (Unsigned)
  [facility] [address] [place] [address] Dept: [phone] Dept Fax: [phone]  Psychotherapy Progress Note  Patient ID: [name], female  DOB: May 04, 1971, 53 y.o.  MRN: [id number]  06/12/2024 Start time: *** End time: ***  Method of Visit: Face-to-Face  Present: {family members:20773}  Current Concerns: ***  Current Symptoms: {Current [phone]}  Psychiatric Specialty Exam: General Appearance: {Appearance:22683}  Eye Contact:  {BHH EYE CONTACT:22684}  Speech:  {Speech:22685}  Volume:  {Volume (PAA):22686}  Mood:  {BHH MOOD:22306}  Affect:  {Affect (PAA):22687}  Thought Process:  {Thought Process (PAA):22688}  Orientation:  {BHH ORIENTATION (PAA):22689}  Thought Content:  {Thought Content:22690}  Suicidal Thoughts:  {ST/HT (PAA):22692}  Homicidal Thoughts:  {ST/HT (PAA):22692}  Memory:  {BHH MEMORY:22881}  Judgement:  {Judgement (PAA):22694}  Insight:  {Insight (PAA):22695}  Psychomotor Activity:  {Psychomotor (PAA):22696}  Concentration:  {Concentration:21399}  Recall:  {BHH GOOD/FAIR/POOR:22877}  Fund of Knowledge:{BHH GOOD/FAIR/POOR:22877}  Language: {BHH GOOD/FAIR/POOR:22877}  Akathisia:  {BHH YES OR NO:22294}  Handed:  {Handed:22697}  AIMS (if indicated):  {Desc; done/not:10129}  Assets:  {Assets (PAA):22698}  ADL's:  {BHH JIO'D:77709}  Cognition: {chl bhh cognition:304700322}  Sleep:  {BHH GOOD/FAIR/POOR:22877}     Diagnosis: ***  Anticipated Frequency of Visits: *** Anticipated Length of Treatment Episode: ***  Short Term Goals/Goals for Treatment Session: *** Progress Towards Goals: {Progress Towards Goals:21014066}  Treatment Intervention: {Treatment [phone]}  Medical Necessity: {Medical Necessity:210140004}  Assessment Tools:    06/22/2022    3:02 PM  Depression screen PHQ 2/9  Decreased Interest 1  Down, Depressed, Hopeless 1  PHQ - 2 Score 2   Altered sleeping 1  Tired, decreased energy 1  Change in appetite 1  Feeling bad or failure about yourself  1  Trouble concentrating 1  Moving slowly or fidgety/restless 0  Suicidal thoughts 0  PHQ-9 Score 7   Failed to redirect to the Timeline version of the REVFS SmartLink. Flowsheet Row ED from 01/31/2024 in [facility] Emergency Department at [facility] ED from 11/23/2023 in [facility] Emergency Department at [facility] ED from 03/29/2023 in [facility] Emergency Department at [facility]  C-SSRS RISK CATEGORY No Risk No Risk No Risk    Collaboration of Care: [facility] OP Collaboration of Rjmz:[id number]}  Patient/Guardian was advised Release of Information must be obtained prior to any record release in order to collaborate their care with an outside provider. Patient/Guardian was advised if they have not already done so to contact the registration department to sign all necessary forms in order for us  to release information regarding their care.   Consent: Patient/Guardian gives verbal consent for treatment and assignment of benefits for services provided during this visit. Patient/Guardian expressed understanding and agreed to proceed.   Plan: PIERRETTE Marlo Masson, MD 06/12/2024

## 2024-06-13 ENCOUNTER — Ambulatory Visit (HOSPITAL_COMMUNITY): Payer: PRIVATE HEALTH INSURANCE | Admitting: Student in an Organized Health Care Education/Training Program

## 2024-06-13 NOTE — Telephone Encounter (Signed)
 Asked staff to reach out to patient to bring in new copy of paperwork at next appointment scheduled for 7/1.  Patient missed this appointment and staff again attempted to reach her and left a voicemail.  Paperwork will be filled out when she is able to bring it in.     Marolyn Rosser DO

## 2024-07-11 ENCOUNTER — Encounter: Admitting: Obstetrics and Gynecology

## 2024-10-04 ENCOUNTER — Ambulatory Visit: Admitting: Family Medicine

## 2025-01-30 ENCOUNTER — Ambulatory Visit (HOSPITAL_COMMUNITY): Payer: PRIVATE HEALTH INSURANCE | Admitting: Student in an Organized Health Care Education/Training Program
